# Patient Record
Sex: Female | Born: 1970 | Race: White | Hispanic: No | Marital: Married | Smoking: Never smoker
Health system: Southern US, Community
[De-identification: ages and names within clinical notes are randomized; demographics above are authoritative.]

## PROBLEM LIST (undated history)

## (undated) DIAGNOSIS — F329 Major depressive disorder, single episode, unspecified: Secondary | ICD-10-CM

## (undated) DIAGNOSIS — F419 Anxiety disorder, unspecified: Secondary | ICD-10-CM

## (undated) DIAGNOSIS — M797 Fibromyalgia: Secondary | ICD-10-CM

## (undated) DIAGNOSIS — T50901A Poisoning by unspecified drugs, medicaments and biological substances, accidental (unintentional), initial encounter: Secondary | ICD-10-CM

## (undated) DIAGNOSIS — K589 Irritable bowel syndrome without diarrhea: Secondary | ICD-10-CM

## (undated) DIAGNOSIS — F32A Depression, unspecified: Secondary | ICD-10-CM

## (undated) DIAGNOSIS — R000 Tachycardia, unspecified: Secondary | ICD-10-CM

## (undated) HISTORY — PX: CHOLECYSTECTOMY: SHX55

## (undated) HISTORY — DX: Anxiety disorder, unspecified: F41.9

## (undated) HISTORY — PX: APPENDECTOMY: SHX54

## (undated) HISTORY — DX: Depression, unspecified: F32.A

## (undated) HISTORY — DX: Major depressive disorder, single episode, unspecified: F32.9

---

## 2015-12-11 ENCOUNTER — Telehealth (HOSPITAL_COMMUNITY): Payer: Self-pay | Admitting: *Deleted

## 2015-12-11 NOTE — Telephone Encounter (Signed)
Office received ref from Dr. Sherryll BurgerShah office to sch a New Pt appt for pt. Called pt to sch appt and a message came up stating pt do not have voicemail and to try call again later.

## 2015-12-21 ENCOUNTER — Telehealth: Payer: Self-pay | Admitting: Cardiology

## 2015-12-21 NOTE — Telephone Encounter (Signed)
Received [facility] for appointment on 01/14/16 with Dr Antoine PocheHochrein.  Records given to [employer] Hines (medical records)

## 2015-12-21 NOTE — Telephone Encounter (Signed)
Appointment on 01/14/16 with Dr [place]

## 2016-01-06 ENCOUNTER — Telehealth (HOSPITAL_COMMUNITY): Payer: Self-pay | Admitting: *Deleted

## 2016-01-06 NOTE — Telephone Encounter (Signed)
phone call to patient regarding an appointment.  patient's voice mail not yet set up.  will contact referring doctor office.

## 2016-01-12 ENCOUNTER — Other Ambulatory Visit: Payer: Self-pay | Admitting: *Deleted

## 2016-01-12 ENCOUNTER — Encounter: Payer: Self-pay | Admitting: *Deleted

## 2016-01-13 NOTE — Progress Notes (Deleted)
Cardiology Office Note   Date:  01/13/2016   ID:  Betty Osborne, DOB [birth year], MRN [id number]  PCP:  No primary care provider on file.  Cardiologist:   Rollene Rotunda, MD   No chief complaint on file.     History of Present Illness: Betty Osborne is a 45 y.o. female who presents for ***    Past Medical History:  Diagnosis Date  . Anxiety and depression     Past Surgical History:  Procedure Laterality Date  . CHOLECYSTECTOMY       Current Outpatient Prescriptions  Medication Sig Dispense Refill  . busPIRone (BUSPAR) 15 MG tablet Take 15 mg by mouth 2 (two) times daily.  1  . cyclobenzaprine (FLEXERIL) 10 MG tablet Take 10 mg by mouth 2 (two) times daily.  1  . DASETTA 1/35 tablet Take 1 tablet by mouth daily.  3  . DULoxetine (CYMBALTA) 60 MG capsule Take 60 mg by mouth daily.  1  . hydrOXYzine (VISTARIL) 25 MG capsule Take 25 mg by mouth 3 (three) times daily.  0  . LORazepam (ATIVAN) 1 MG tablet Take 1 mg by mouth 2 (two) times daily as needed.  1   No current facility-administered medications for this visit.     Allergies:   Review of patient's allergies indicates not on file.    Social History:  The patient  reports that she has quit smoking. She does not have any smokeless tobacco history on file.   Family History:  The patient's ***family history includes Heart disease in her father.    ROS:  Please see the history of present illness.   Otherwise, review of systems are positive for {NONE DEFAULTED:18576::"none"}.   All other systems are reviewed and negative.    PHYSICAL EXAM: VS:  There were no vitals taken for this visit. , BMI There is no height or weight on file to calculate BMI. GENERAL:  Well appearing HEENT:  Pupils equal round and reactive, fundi not visualized, oral mucosa unremarkable NECK:  No jugular venous distention, waveform within normal limits, carotid upstroke brisk and symmetric, no bruits, no thyromegaly LYMPHATICS:  No  cervical, inguinal adenopathy LUNGS:  Clear to auscultation bilaterally BACK:  No CVA tenderness CHEST:  Unremarkable HEART:  PMI not displaced or sustained,S1 and S2 within normal limits, no S3, no S4, no clicks, no rubs, *** murmurs ABD:  Flat, positive bowel sounds normal in frequency in pitch, no bruits, no rebound, no guarding, no midline pulsatile mass, no hepatomegaly, no splenomegaly EXT:  2 plus pulses throughout, no edema, no cyanosis no clubbing SKIN:  No rashes no nodules NEURO:  Cranial nerves II through XII grossly intact, motor grossly intact throughout PSYCH:  Cognitively intact, oriented to person place and time    EKG:  EKG {ACTION; IS/IS XLK:44010272} ordered today. The ekg ordered today demonstrates ***   Recent Labs: No results found for requested labs within last 8760 hours.    Lipid Panel No results found for: CHOL, TRIG, HDL, CHOLHDL, VLDL, LDLCALC, LDLDIRECT    Wt Readings from Last 3 Encounters:  No data found for Wt      Other studies Reviewed: Additional studies/ records that were reviewed today include: ***. Review of the above records demonstrates:  Please see elsewhere in the note.  ***   ASSESSMENT AND PLAN:  ***   Current medicines are reviewed at length with the patient today.  The patient {ACTIONS; HAS/DOES NOT HAVE:19233} concerns regarding medicines.  The following changes have been made:  {PLAN; NO CHANGE:13088:s}  Labs/ tests ordered today include: *** No orders of the defined types were placed in this encounter.    Disposition:   FU with ***    Signed, Rollene RotundaJames Laquincy Eastridge, MD  01/13/2016 7:24 AM    [facility]

## 2016-01-14 ENCOUNTER — Ambulatory Visit: Payer: Self-pay | Admitting: Cardiology

## 2016-02-17 ENCOUNTER — Telehealth (HOSPITAL_COMMUNITY): Payer: Self-pay | Admitting: *Deleted

## 2016-02-17 NOTE — Telephone Encounter (Signed)
Office received ref for pt and pt is sch

## 2016-02-17 NOTE — Telephone Encounter (Signed)
Phone call regarding an appoiontment, no voice mail set up.

## 2016-02-24 ENCOUNTER — Ambulatory Visit: Payer: Self-pay | Admitting: Cardiology

## 2016-03-02 ENCOUNTER — Encounter: Payer: Self-pay | Admitting: Gastroenterology

## 2016-03-02 NOTE — Progress Notes (Deleted)
Psychiatric Initial Adult Assessment   Patient Identification: Betty Osborne MRN:  [id number] Date of Evaluation:  03/02/2016 Referral Source: *** Chief Complaint:   Visit Diagnosis: No diagnosis found.  History of Present Illness:  ***  Associated Signs/Symptoms: Depression Symptoms:  {DEPRESSION SYMPTOMS:20000} (Hypo) Manic Symptoms:  {BHH MANIC SYMPTOMS:22872} Anxiety Symptoms:  {BHH ANXIETY SYMPTOMS:22873} Psychotic Symptoms:  {BHH PSYCHOTIC SYMPTOMS:22874} PTSD Symptoms: {BHH PTSD SYMPTOMS:22875}  Past Psychiatric History: ***  Previous Psychotropic Medications: {YES/NO:21197}  Substance Abuse History in the last 12 months:  {yes no:314532}  Consequences of Substance Abuse: {BHH CONSEQUENCES OF SUBSTANCE ABUSE:22880}  Past Medical History:  Past Medical History:  Diagnosis Date  . Anxiety and depression     Past Surgical History:  Procedure Laterality Date  . CHOLECYSTECTOMY      Family Psychiatric History: ***  Family History:  Family History  Problem Relation Age of Onset  . Heart disease Father     had a heart transplant in 2008    Social History:   Social History   Social History  . Marital status: Married    Spouse name: N/A  . Number of children: N/A  . Years of education: N/A   Social History Main Topics  . Smoking status: Former Games developer  . Smokeless tobacco: Not on file  . Alcohol use Not on file  . Drug use: Unknown  . Sexual activity: Not on file   Other Topics Concern  . Not on file   Social History Narrative  . No narrative on file    Additional Social History: ***  Allergies:  Allergies not on file  Metabolic Disorder Labs: No results found for: HGBA1C, MPG No results found for: PROLACTIN No results found for: CHOL, TRIG, HDL, CHOLHDL, VLDL, LDLCALC   Current Medications: Current Outpatient Prescriptions  Medication Sig Dispense Refill  . busPIRone (BUSPAR) 15 MG tablet Take 15 mg by mouth 2 (two) times daily.  1   . cyclobenzaprine (FLEXERIL) 10 MG tablet Take 10 mg by mouth 2 (two) times daily.  1  . DASETTA 1/35 tablet Take 1 tablet by mouth daily.  3  . DULoxetine (CYMBALTA) 60 MG capsule Take 60 mg by mouth daily.  1  . hydrOXYzine (VISTARIL) 25 MG capsule Take 25 mg by mouth 3 (three) times daily.  0  . LORazepam (ATIVAN) 1 MG tablet Take 1 mg by mouth 2 (two) times daily as needed.  1   No current facility-administered medications for this visit.     Neurologic: Headache: {BHH YES OR NO:22294} Seizure: {BHH YES OR NO:22294} Paresthesias:{BHH YES OR WU:98119}  Musculoskeletal: Strength & Muscle Tone: {desc; muscle tone:32375} Gait & Station: {PE GAIT ED JYNW:29562} Patient leans: {Patient Leans:21022755}  Psychiatric Specialty Exam: ROS  There were no vitals taken for this visit.There is no height or weight on file to calculate BMI.  General Appearance: {Appearance:22683}  Eye Contact:  {BHH EYE CONTACT:22684}  Speech:  {Speech:22685}  Volume:  {Volume (PAA):22686}  Mood:  {BHH MOOD:22306}  Affect:  {Affect (PAA):22687}  Thought Process:  {Thought Process (PAA):22688}  Orientation:  {BHH ORIENTATION (PAA):22689}  Thought Content:  {Thought Content:22690}  Suicidal Thoughts:  {ST/HT (PAA):22692}  Homicidal Thoughts:  {ST/HT (PAA):22692}  Memory:  {BHH MEMORY:22881}  Judgement:  {Judgement (PAA):22694}  Insight:  {Insight (PAA):22695}  Psychomotor Activity:  {Psychomotor (PAA):22696}  Concentration:  {Concentration:21399}  Recall:  {BHH GOOD/FAIR/POOR:22877}  Fund of Knowledge:{BHH GOOD/FAIR/POOR:22877}  Language: {BHH GOOD/FAIR/POOR:22877}  Akathisia:  {BHH YES OR NO:22294}  Handed:  {Handed:22697}  AIMS (if indicated):  ***  Assets:  {Assets (PAA):22698}  ADL's:  {BHH ZOX'W:96045}ADL'S:22290}  Cognition: {chl bhh cognition:304700322}  Sleep:  ***    Treatment Plan Summary: {CHL AMB BH MD TX WUJW:1191478295}[phone]}   Neysa Hottereina Shelaine Frie, MD 9/27/201712:35 PM

## 2016-03-03 ENCOUNTER — Ambulatory Visit (HOSPITAL_COMMUNITY): Payer: Self-pay | Admitting: Psychiatry

## 2016-03-21 ENCOUNTER — Telehealth: Payer: Self-pay | Admitting: Gastroenterology

## 2016-03-21 ENCOUNTER — Ambulatory Visit: Payer: Self-pay | Admitting: Gastroenterology

## 2016-03-21 ENCOUNTER — Encounter: Payer: Self-pay | Admitting: Gastroenterology

## 2016-03-21 NOTE — Telephone Encounter (Signed)
PT WAS A NO SHOW AND LETTER SENT  °

## 2016-05-19 ENCOUNTER — Encounter (HOSPITAL_COMMUNITY): Payer: Self-pay

## 2016-05-19 ENCOUNTER — Emergency Department (HOSPITAL_COMMUNITY)
Admission: EM | Admit: 2016-05-19 | Discharge: 2016-05-19 | Payer: Medicaid Other | Attending: Emergency Medicine | Admitting: Emergency Medicine

## 2016-05-19 ENCOUNTER — Inpatient Hospital Stay (HOSPITAL_COMMUNITY)
Admission: AD | Admit: 2016-05-19 | Discharge: 2016-05-22 | DRG: 885 | Disposition: A | Discharge status: Other Acute Inpatient Hospital | Payer: Medicaid Other | Source: Intra-hospital | Attending: Family Medicine | Admitting: Family Medicine

## 2016-05-19 DIAGNOSIS — Z7984 Long term (current) use of oral hypoglycemic drugs: Secondary | ICD-10-CM | POA: Insufficient documentation

## 2016-05-19 DIAGNOSIS — G4700 Insomnia, unspecified: Secondary | ICD-10-CM | POA: Diagnosis present

## 2016-05-19 DIAGNOSIS — Z79899 Other long term (current) drug therapy: Secondary | ICD-10-CM | POA: Diagnosis not present

## 2016-05-19 DIAGNOSIS — Z8249 Family history of ischemic heart disease and other diseases of the circulatory system: Secondary | ICD-10-CM

## 2016-05-19 DIAGNOSIS — Z885 Allergy status to narcotic agent status: Secondary | ICD-10-CM

## 2016-05-19 DIAGNOSIS — Z87891 Personal history of nicotine dependence: Secondary | ICD-10-CM | POA: Insufficient documentation

## 2016-05-19 DIAGNOSIS — M797 Fibromyalgia: Secondary | ICD-10-CM | POA: Diagnosis present

## 2016-05-19 DIAGNOSIS — R079 Chest pain, unspecified: Secondary | ICD-10-CM

## 2016-05-19 DIAGNOSIS — D649 Anemia, unspecified: Secondary | ICD-10-CM | POA: Diagnosis present

## 2016-05-19 DIAGNOSIS — E119 Type 2 diabetes mellitus without complications: Secondary | ICD-10-CM | POA: Diagnosis present

## 2016-05-19 DIAGNOSIS — K589 Irritable bowel syndrome without diarrhea: Secondary | ICD-10-CM | POA: Diagnosis present

## 2016-05-19 DIAGNOSIS — F332 Major depressive disorder, recurrent severe without psychotic features: Secondary | ICD-10-CM | POA: Insufficient documentation

## 2016-05-19 DIAGNOSIS — R9439 Abnormal result of other cardiovascular function study: Secondary | ICD-10-CM | POA: Diagnosis not present

## 2016-05-19 DIAGNOSIS — R45851 Suicidal ideations: Secondary | ICD-10-CM | POA: Diagnosis present

## 2016-05-19 DIAGNOSIS — R0789 Other chest pain: Secondary | ICD-10-CM | POA: Diagnosis not present

## 2016-05-19 DIAGNOSIS — Z915 Personal history of self-harm: Secondary | ICD-10-CM | POA: Diagnosis not present

## 2016-05-19 DIAGNOSIS — I10 Essential (primary) hypertension: Secondary | ICD-10-CM | POA: Diagnosis present

## 2016-05-19 DIAGNOSIS — F329 Major depressive disorder, single episode, unspecified: Secondary | ICD-10-CM | POA: Diagnosis present

## 2016-05-19 DIAGNOSIS — Z9889 Other specified postprocedural states: Secondary | ICD-10-CM | POA: Diagnosis not present

## 2016-05-19 DIAGNOSIS — R072 Precordial pain: Secondary | ICD-10-CM | POA: Diagnosis not present

## 2016-05-19 DIAGNOSIS — Z9049 Acquired absence of other specified parts of digestive tract: Secondary | ICD-10-CM

## 2016-05-19 HISTORY — DX: Poisoning by unspecified drugs, medicaments and biological substances, accidental (unintentional), initial encounter: T50.901A

## 2016-05-19 HISTORY — DX: Irritable bowel syndrome, unspecified: K58.9

## 2016-05-19 HISTORY — DX: Fibromyalgia: M79.7

## 2016-05-19 HISTORY — DX: Tachycardia, unspecified: R00.0

## 2016-05-19 LAB — CBC WITH DIFFERENTIAL/PLATELET
BASOS ABS: 0.1 10*3/uL (ref 0.0–0.1)
BASOS PCT: 1 %
Eosinophils Absolute: 0.4 10*3/uL (ref 0.0–0.7)
Eosinophils Relative: 4 %
HEMATOCRIT: 40.7 % (ref 36.0–46.0)
HEMOGLOBIN: 13.6 g/dL (ref 12.0–15.0)
LYMPHS PCT: 32 %
Lymphs Abs: 3.1 10*3/uL (ref 0.7–4.0)
MCH: 29.4 pg (ref 26.0–34.0)
MCHC: 33.4 g/dL (ref 30.0–36.0)
MCV: 88.1 fL (ref 78.0–100.0)
Monocytes Absolute: 0.6 10*3/uL (ref 0.1–1.0)
Monocytes Relative: 6 %
NEUTROS PCT: 57 %
Neutro Abs: 5.8 10*3/uL (ref 1.7–7.7)
Platelets: 298 10*3/uL (ref 150–400)
RBC: 4.62 MIL/uL (ref 3.87–5.11)
RDW: 12.7 % (ref 11.5–15.5)
WBC: 10 10*3/uL (ref 4.0–10.5)

## 2016-05-19 LAB — ETHANOL: Alcohol, Ethyl (B): <5 mg/dL (ref <5)

## 2016-05-19 LAB — RAPID URINE DRUG SCREEN, HOSP PERFORMED
AMPHETAMINES: NOT DETECTED
BENZODIAZEPINES: NOT DETECTED
Barbiturates: NOT DETECTED
COCAINE: NOT DETECTED
OPIATES: NOT DETECTED
TETRAHYDROCANNABINOL: NOT DETECTED

## 2016-05-19 LAB — BASIC METABOLIC PANEL
ANION GAP: 8 (ref 5–15)
BUN: 13 mg/dL (ref 6–20)
CHLORIDE: 96 mmol/L — AB (ref 101–111)
CO2: 28 mmol/L (ref 22–32)
Calcium: 9.2 mg/dL (ref 8.9–10.3)
Creatinine, Ser: 0.8 mg/dL (ref 0.44–1.00)
GFR calc non Af Amer: >60 mL/min (ref >60)
Glucose, Bld: 93 mg/dL (ref 65–99)
POTASSIUM: 3.7 mmol/L (ref 3.5–5.1)
Sodium: 132 mmol/L — ABNORMAL LOW (ref 135–145)

## 2016-05-19 MED ORDER — ATENOLOL 50 MG PO TABS
50.0000 mg | ORAL_TABLET | Freq: Every day | ORAL | Status: DC
  Administered 2016-05-20 – 2016-05-22 (×3): 50 mg via ORAL
  Filled 2016-05-19: qty 1
  Filled 2016-05-19: qty 2
  Filled 2016-05-19 (×4): qty 1

## 2016-05-19 MED ORDER — LORAZEPAM 1 MG PO TABS
1.0000 mg | ORAL_TABLET | Freq: Once | ORAL | Status: AC
  Administered 2016-05-19: 1 mg via ORAL
  Filled 2016-05-19: qty 1

## 2016-05-19 MED ORDER — MAGNESIUM HYDROXIDE 400 MG/5ML PO SUSP: 30.0000 mL | Freq: Every day | ORAL | Status: DC | PRN

## 2016-05-19 MED ORDER — ACETAMINOPHEN 325 MG PO TABS
650.0000 mg | ORAL_TABLET | Freq: Four times a day (QID) | ORAL | Status: DC | PRN
  Administered 2016-05-20 – 2016-05-21 (×2): 650 mg via ORAL
  Filled 2016-05-19 (×2): qty 2

## 2016-05-19 MED ORDER — HYDROXYZINE HCL 25 MG PO TABS
25.0000 mg | ORAL_TABLET | Freq: Four times a day (QID) | ORAL | Status: DC | PRN
  Administered 2016-05-20 (×2): 25 mg via ORAL
  Filled 2016-05-19 (×2): qty 1

## 2016-05-19 MED ORDER — FLUTICASONE PROPIONATE 50 MCG/ACT NA SUSP
2.0000 | Freq: Every day | NASAL | Status: DC
  Administered 2016-05-20 – 2016-05-22 (×3): 2 via NASAL
  Filled 2016-05-19 (×2): qty 16

## 2016-05-19 MED ORDER — METFORMIN HCL 500 MG PO TABS
1000.0000 mg | ORAL_TABLET | Freq: Two times a day (BID) | ORAL | Status: DC
  Administered 2016-05-20 – 2016-05-22 (×6): 1000 mg via ORAL
  Filled 2016-05-19 (×11): qty 2

## 2016-05-19 MED ORDER — ALUM & MAG HYDROXIDE-SIMETH 200-200-20 MG/5ML PO SUSP: 30.0000 mL | ORAL | Status: DC | PRN

## 2016-05-19 MED ORDER — DULOXETINE HCL 60 MG PO CPEP
90.0000 mg | ORAL_CAPSULE | Freq: Every day | ORAL | Status: DC
  Administered 2016-05-20: 08:00:00 90 mg via ORAL
  Filled 2016-05-19 (×3): qty 1

## 2016-05-19 MED ORDER — ACETAMINOPHEN 325 MG PO TABS
650.0000 mg | ORAL_TABLET | Freq: Once | ORAL | Status: AC
  Administered 2016-05-19: 650 mg via ORAL
  Filled 2016-05-19: qty 2

## 2016-05-19 MED ORDER — LORAZEPAM 1 MG PO TABS
ORAL_TABLET | ORAL | Status: AC
  Filled 2016-05-19: qty 1

## 2016-05-19 MED ORDER — TRAZODONE HCL 50 MG PO TABS
50.0000 mg | ORAL_TABLET | Freq: Every evening | ORAL | Status: DC | PRN
  Administered 2016-05-20 – 2016-05-21 (×3): 50 mg via ORAL
  Filled 2016-05-19 (×3): qty 1

## 2016-05-19 NOTE — ED Notes (Signed)
Called [facility] for transport to [facility]. 

## 2016-05-19 NOTE — BH Assessment (Addendum)
Tele Assessment Note   Betty Osborne is an 45 y.o. female who presents voluntarily  reporting symptoms of depression, no sleep, lack of functioning, showering. Pt states that she was discharged from [facility] on November 30th, and "I really thought I was ready for discharge, but they took me off many of my medications and changed some things and I am not functioning". She states that this is the 2nd Christmas without her mom and and that plus the stress of caring for her dad as an only child is too much for her,  "I don't want to try to kill myself again, I almost died last time. I want to live for my husband and 14yo son".  Pt reports medication compliance, but losing her sleep medication and anti-anxiety meds has been hard for her.  Past attempts include a serious overdose in November and another attempt two years ago and she had an admission at [facility] at that time. PT denies homicidal ideation/ history of violence. Pt denies auditory or visual hallucinations or other psychotic symptoms. Pt lives with her husband and son who are her supports.  Pt denies history of abuse and trauma.Pt  reports there is a family history of mental illness. Pt has good insight and judgment. Pt's memory is typical. Pt denies legal history. ? Pt has no OP provider, but has been trying to get one since her discharge. Pt has a history of alcohol abuse. ? MSE: Pt is casually dressed, alert, oriented x4 with normal speech and normal motor behavior. Eye contact is good. Pt's mood is depressed and affect is depressed and anxious. Affect is congruent with mood. Thought process is coherent and relevant. There is no indication Pt is currently responding to internal stimuli or experiencing delusional thought content. Pt was cooperative throughout assessment. Pt is currently unable to contract for safety outside the hospital and wants inpatient psychiatric treatment.  Elta Guadeloupe, NP recommends Ip treatment.  TTS to seek  placement. Pt accepted to [facility] when medically cleared per Mardella Layman, [facility].     Diagnosis: MDD, severe without psychotic features, alcohol abuse by history  Past Medical History:  Past Medical History:  Diagnosis Date  . Anxiety and depression   . Fibromyalgia   . IBS (irritable bowel syndrome)   . Overdose   . Tachycardia     Past Surgical History:  Procedure Laterality Date  . APPENDECTOMY    . CHOLECYSTECTOMY      Family History:  Family History  Problem Relation Age of Onset  . Heart disease Father     had a heart transplant in 2008    Social History:  reports that she has quit smoking. She has never used smokeless tobacco. She reports that she does not use drugs. Her alcohol history is not on file.  Additional Social History:  Alcohol / Drug Use Pain Medications: Denies Prescriptions: Denies Over the Counter: Denies History of alcohol / drug use?:  (hx of alcohol abuse) Longest period of sobriety (when/how long): Denies Negative Consequences of Use:  (Denies)  CIWA: CIWA-Ar BP: (!) 134/101 Pulse Rate: 76 COWS:    PATIENT STRENGTHS: (choose at least two) Ability for insight Active sense of humor Average or above average intelligence Capable of independent living Communication skills General fund of knowledge Motivation for treatment/growth Supportive family/friends  Allergies:  Allergies  Allergen Reactions  . Dilaudid [Hydromorphone Hcl]     Home Medications:  (Not in a hospital admission)  OB/GYN Status:  No  LMP recorded. Patient is postmenopausal.  General Assessment Data Location of Assessment: [facility] TTS Assessment: In system Is this a Tele or Face-to-Face Assessment?: Tele Assessment Is this an Initial Assessment or a Re-assessment for this encounter?: Initial Assessment Marital status: Married Living Arrangements: Spouse/significant other 59(14 yo son) Can pt return to current living arrangement?: Yes Admission Status: Voluntary Is  patient capable of signing voluntary admission?: Yes Referral Source: Self/Family/Friend Insurance type: MCD     Crisis Care Plan Living Arrangements: Spouse/significant other 86(14 yo son) Name of Psychiatrist: none Name of Therapist: no  Education Status Is patient currently in school?: No  Risk to self with the past 6 months Suicidal Ideation: No-Not Currently/Within Last 6 Months Has patient been a risk to self within the past 6 months prior to admission? : Yes Suicidal Intent: No-Not Currently/Within Last 6 Months Has patient had any suicidal intent within the past 6 months prior to admission? : Yes Is patient at risk for suicide?: Yes Suicidal Plan?: No-Not Currently/Within Last 6 Months Has patient had any suicidal plan within the past 6 months prior to admission? : Yes Access to Means: Yes Specify Access to Suicidal Means: overdose What has been your use of drugs/alcohol within the last 12 months?: denies Previous Attempts/Gestures: Yes How many times?: 3 Other Self Harm Risks:  (none known) Triggers for Past Attempts: Unpredictable Intentional Self Injurious Behavior: None Family Suicide History:  (aunt attempted) Recent stressful life event(s):  (mom died two years ago, taking care of dad) Persecutory voices/beliefs?: No Depression: Yes Depression Symptoms: Despondent, Insomnia, Tearfulness, Isolating, Fatigue, Guilt, Loss of interest in usual pleasures, Feeling worthless/self pity, Feeling angry/irritable Substance abuse history and/or treatment for substance abuse?: Yes Suicide prevention information given to non-admitted patients: Not applicable  Risk to Others within the past 6 months Homicidal Ideation: No Does patient have any lifetime risk of violence toward others beyond the six months prior to admission? : No Thoughts of Harm to Others: No Current Homicidal Intent: No Current Homicidal Plan: No Access to Homicidal Means: No History of harm to others?:  No Assessment of Violence: None Noted Does patient have access to weapons?: Yes (Comment) Criminal Charges Pending?: No Does patient have a court date: No Is patient on probation?: No  Psychosis Hallucinations: None noted Delusions: None noted  Mental Status Report Appearance/Hygiene: Disheveled, Revealing clothes/seductive clothing Eye Contact: Good Motor Activity: Unremarkable Speech: Logical/coherent Level of Consciousness: Alert Mood: Depressed, Sad Affect: Anxious, Depressed Anxiety Level: Moderate Thought Processes: Coherent, Relevant Judgement: Unimpaired Orientation: Person, Place, Time, Situation, Appropriate for developmental age Obsessive Compulsive Thoughts/Behaviors: None  Cognitive Functioning Concentration: Decreased Memory: Recent Intact, Remote Intact IQ: Average Insight: Good Impulse Control: Fair Appetite: Poor Weight Loss: 5 Weight Gain:  (with medications) Sleep: Decreased Total Hours of Sleep: 3 Vegetative Symptoms: Staying in bed, Decreased grooming  ADLScreening [facility] Assessment Services) Patient's cognitive ability adequate to safely complete daily activities?: Yes Patient able to express need for assistance with ADLs?: Yes Independently performs ADLs?: Yes (appropriate for developmental age)  Prior Inpatient Therapy Prior Inpatient Therapy: Yes Prior Therapy Dates: November 2017, 2015 [facility] Reason for Treatment: depression  Prior Outpatient Therapy Prior Outpatient Therapy: No Does patient have an ACCT team?: No Does patient have Intensive In-House Services?  : No Does patient have [facility] services? : No Does patient have P4CC services?: No  ADL Screening (condition at time of admission) Patient's cognitive ability adequate to safely complete daily activities?: Yes Is the  patient deaf or have difficulty hearing?: No Does the patient have difficulty seeing, even when wearing  glasses/contacts?: No Does the patient have difficulty concentrating, remembering, or making decisions?: No Patient able to express need for assistance with ADLs?: Yes Does the patient have difficulty dressing or bathing?: No Independently performs ADLs?: Yes (appropriate for developmental age) Does the patient have difficulty walking or climbing stairs?: No Weakness of Legs: None Weakness of Arms/Hands: None  Home Assistive Devices/Equipment Home Assistive Devices/Equipment: None    Abuse/Neglect Assessment (Assessment to be complete while patient is alone) Physical Abuse: Denies Verbal Abuse: Denies Sexual Abuse: Denies Exploitation of patient/patient's resources: Denies Self-Neglect: Denies Values / Beliefs Cultural Requests During Hospitalization: None Spiritual Requests During Hospitalization: None   Advance Directives (For Healthcare) Does Patient Have a Medical Advance Directive?: No Would patient like information on creating a medical advance directive?: No - Patient declined    Additional Information 1:1 In Past 12 Months?: No CIRT Risk: No Elopement Risk: No Does patient have medical clearance?: Yes     Disposition:  Disposition Initial Assessment Completed for this Encounter: Yes Disposition of Patient: Inpatient treatment program Type of inpatient treatment program: Adult  [facility],Nickayla Mcinnis Hines 05/19/2016 4:20 PM

## 2016-05-19 NOTE — ED Notes (Addendum)
Patient requesting something for his abdominal pain. Verbal order given by Dr. Hyacinth MeekerMiller.

## 2016-05-19 NOTE — ED Provider Notes (Signed)
[facility] DEPT Provider Note   CSN: [id number] Arrival date & time: 05/19/16  1251     History   Chief Complaint Chief Complaint  Patient presents with  . Depression    HPI Betty Osborne is a 45 y.o. female.  HPI  The patient is a 45 year old female who has a history of anxiety and depression with several suicide attempts in the past including an overdose followed by cutting her wrists followed by second overdose. Her most recent overdose was in the middle of November after which she spent 12 days in a psychiatric facility and was discharged on November 30 back to her home. She reports that at that time she was feeling much better, she was not depressed anxious or suicidal and initially was doing very well however after 3 days she started to sleep last, started to have intermittent anxiety which has become significantly worse. She reports that this is a tough season for her with Christmas, this is a second year in a row that she has been without her mother, she feels as though she is on for her for the Christmas season, she is taking care of her father who is also chronically ill. She has had several worsening episodes of anxiety, uncontrolled crying, she is not suicidal but needs help. She reports that she tried to get in with a psychiatrist but has been unable to be seen when she called for a follow-up appointment. She has recently been given certain medications which she feels occasionally help including an antihistamine as well as Celexa and gabapentin. She does not abuse any drugs  Past Medical History:  Diagnosis Date  . Anxiety and depression   . Fibromyalgia   . IBS (irritable bowel syndrome)   . Overdose   . Tachycardia     There are no active problems to display for this patient.   Past Surgical History:  Procedure Laterality Date  . APPENDECTOMY    . CHOLECYSTECTOMY      OB History    No data available       Home Medications    Prior to Admission  medications   Medication Sig Start Date End Date Taking? Authorizing Provider  busPIRone (BUSPAR) 15 MG tablet Take 15 mg by mouth 2 (two) times daily. 12/02/15   Historical Provider, MD  cyclobenzaprine (FLEXERIL) 10 MG tablet Take 10 mg by mouth 2 (two) times daily. 12/02/15   Historical Provider, MD  DASETTA 1/35 tablet Take 1 tablet by mouth daily. 12/02/15   Historical Provider, MD  DULoxetine (CYMBALTA) 60 MG capsule Take 60 mg by mouth daily. 12/04/15   Historical Provider, MD  hydrOXYzine (VISTARIL) 25 MG capsule Take 25 mg by mouth 3 (three) times daily. 12/07/15   Historical Provider, MD  LORazepam (ATIVAN) 1 MG tablet Take 1 mg by mouth 2 (two) times daily as needed. 12/02/15   Historical Provider, MD    Family History Family History  Problem Relation Age of Onset  . Heart disease Father     had a heart transplant in 2008    Social History Social History  Substance Use Topics  . Smoking status: Former Games developermoker  . Smokeless tobacco: Never Used  . Alcohol use Not on file     Comment: former     Allergies   Dilaudid [hydromorphone hcl]   Review of Systems Review of Systems  All other systems reviewed and are negative.    Physical Exam Updated Vital Signs BP (!) 134/101 (BP Location:  Left Arm)   Pulse 76   Temp 98.9 F (37.2 C) (Temporal)   Resp 20   Ht 5\' 4"  (1.626 m)   Wt 160 lb (72.6 kg)   SpO2 100%   BMI 27.46 kg/m   Physical Exam  Constitutional: She appears well-developed and well-nourished. No distress.  HENT:  Head: Normocephalic and atraumatic.  Mouth/Throat: Oropharynx is clear and moist. No oropharyngeal exudate.  Eyes: Conjunctivae and EOM are normal. Pupils are equal, round, and reactive to light. Right eye exhibits no discharge. Left eye exhibits no discharge. No scleral icterus.  Neck: Normal range of motion. Neck supple. No JVD present. No thyromegaly present.  Cardiovascular: Normal rate, regular rhythm, normal heart sounds and intact distal  pulses.  Exam reveals no gallop and no friction rub.   No murmur heard. Pulmonary/Chest: Effort normal and breath sounds normal. No respiratory distress. She has no wheezes. She has no rales.  Abdominal: Soft. Bowel sounds are normal. She exhibits no distension and no mass. There is tenderness ( diffuse mild ttp without guarding).  Musculoskeletal: Normal range of motion. She exhibits no edema or tenderness.  Lymphadenopathy:    She has no cervical adenopathy.  Neurological: She is alert. Coordination normal.  Skin: Skin is warm and dry. No rash noted. No erythema.  Psychiatric: She has a normal mood and affect. Her behavior is normal.  Nursing note and vitals reviewed.    ED Treatments / Results  Labs (all labs ordered are listed, but only abnormal results are displayed) Labs Reviewed  BASIC METABOLIC PANEL - Abnormal; Notable for the following:       Result Value   Sodium 132 (*)    Chloride 96 (*)    All other components within normal limits  CBC WITH DIFFERENTIAL/PLATELET  ETHANOL  RAPID URINE DRUG SCREEN, HOSP PERFORMED    Radiology No results found.  Procedures Procedures (including critical care time)  Medications Ordered in ED Medications - No data to display   Initial Impression / Assessment and Plan / ED Course  I have reviewed the triage vital signs and the nursing notes.  Pertinent labs & imaging results that were available during my care of the patient were reviewed by me and considered in my medical decision making (see chart for details).  Clinical Course     The patient has ongoing depression and anxiety, often becoming uncontrollably tearful during the interview She would benefit from psychiatric assessment though at this time I do not think she needs to be involuntarily committed  [facility] will [facility]  Final Clinical Impressions(s) / ED Diagnoses   Final diagnoses:  Severe episode of recurrent major depressive  disorder, without psychotic features [facility](HCC)    New Prescriptions New Prescriptions   No medications on file     Eber HongBrian Carrisa Keller, MD 05/19/16 2008

## 2016-05-19 NOTE — ED Notes (Signed)
Patient stating her abdominal pain is no better and she feels anxious. Spoke to Dr. Hyacinth MeekerMiller and received new orders.

## 2016-05-19 NOTE — ED Notes (Signed)
Patient husband her took purse home with him [facility]

## 2016-05-19 NOTE — BH Assessment (Signed)
[facility] Elta GuadeloupeLaurie Parks, NP recommends Ip treatment.  TTS to seek placement. [id number] medically [facility]sey[facility]

## 2016-05-19 NOTE — ED Triage Notes (Signed)
Pt reports was [facility] because she overdosed in attempt to commit suicide on Nov 19.  Pt was discharged on Nov 30 but has not had any behavioral health follow up since then.  She has seen pcp several times since discharge.  Says her pcp instructed her to follow up with [facility] but was told she couldn't come there until she had the paperwork from where she was discharged.  Pt says this was her 3rd attempt at suicide and says she almost died last time.  Pt says has been having panic attacks, tearful, and overwhelmed.  Reports has not had a shower in 3 days before today.  Pt says she lost her mother last year and her father is having some medical problems.  Also is having some financial problems.  Pt also reports she didn't take her son to school yesterday because she couldn't get out of bed.  She states, "life is overwhelming" but doesn't want to kill herself at this time.

## 2016-05-20 ENCOUNTER — Encounter (HOSPITAL_COMMUNITY): Payer: Self-pay | Admitting: *Deleted

## 2016-05-20 DIAGNOSIS — R079 Chest pain, unspecified: Secondary | ICD-10-CM

## 2016-05-20 DIAGNOSIS — Z79899 Other long term (current) drug therapy: Secondary | ICD-10-CM

## 2016-05-20 DIAGNOSIS — D649 Anemia, unspecified: Secondary | ICD-10-CM

## 2016-05-20 DIAGNOSIS — Z8249 Family history of ischemic heart disease and other diseases of the circulatory system: Secondary | ICD-10-CM

## 2016-05-20 DIAGNOSIS — F332 Major depressive disorder, recurrent severe without psychotic features: Principal | ICD-10-CM

## 2016-05-20 MED ORDER — GABAPENTIN 400 MG PO CAPS
800.0000 mg | ORAL_CAPSULE | Freq: Three times a day (TID) | ORAL | Status: DC
  Administered 2016-05-20 – 2016-05-22 (×8): 800 mg via ORAL
  Filled 2016-05-20 (×13): qty 2

## 2016-05-20 MED ORDER — CITALOPRAM HYDROBROMIDE 10 MG PO TABS: 10.0000 mg | ORAL_TABLET | Freq: Every day | ORAL | Status: DC

## 2016-05-20 MED ORDER — CITALOPRAM HYDROBROMIDE 10 MG PO TABS
10.0000 mg | ORAL_TABLET | Freq: Every day | ORAL | Status: DC
  Administered 2016-05-21 – 2016-05-22 (×2): 10 mg via ORAL
  Filled 2016-05-20 (×4): qty 1

## 2016-05-20 MED ORDER — BUPROPION HCL ER (XL) 150 MG PO TB24
150.0000 mg | ORAL_TABLET | Freq: Every day | ORAL | Status: DC
  Administered 2016-05-20 – 2016-05-22 (×3): 150 mg via ORAL
  Filled 2016-05-20 (×5): qty 1

## 2016-05-20 MED ORDER — LORAZEPAM 1 MG PO TABS
0.5000 mg | ORAL_TABLET | Freq: Four times a day (QID) | ORAL | Status: DC | PRN
  Administered 2016-05-20 – 2016-05-22 (×5): 0.5 mg via ORAL
  Filled 2016-05-20 (×6): qty 1

## 2016-05-20 NOTE — Progress Notes (Signed)
Vitals entered on behalf of night shift MHT 

## 2016-05-20 NOTE — H&P (Addendum)
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 MDD (major depressive disorder), recurrent severe, without psychosis ([place]) [F33.2] 05/19/2016   History of Present Illness: 45 year old female . States she was recently admitted to an inpatient psychiatric unit due to a suicide attempt by overdosing ( flexeril, ativan) , returned home about 1-2 weeks ago. States " I was initially  feeling better in the hospital, but back at home I have not been doing well ". Describes worsening anxiety, frequent  panic attacks, and " feeling like I am having melt downs". She also describes worsening depression, worsening neuro-vegetative symptoms, such as insomnia, low energy level, and states " I know myself and I felt I was losing ground and if I did not get the help I would end up trying to kill myself again "  Associated Signs/Symptoms: Depression Symptoms:  depressed mood, anhedonia, insomnia, suicidal thoughts without plan, anxiety, panic attacks, loss of energy/fatigue, decreased appetite,  Low sense of self esteem (Hypo) Manic Symptoms:  Does not endorse . Anxiety Symptoms:  Describes recent increased frequency panic attacks and emerging agoraphobia.  Psychotic Symptoms:  Denies  PTSD Symptoms: Denies  Total Time spent with patient: 45 minutes  Past Psychiatric History: has had two prior psychiatric admissions, has attempted suicide three times , first one three years ago.(Overdose attempt). Denies any history of self cutting. Denies any history of mania, hypomania, denies history of psychosis, denies history of PTSD, denies history of violence.   Is the patient at risk to self? Yes.    Has the  patient been a risk to self in the past 6 months? Yes.    Has the patient been a risk to self within the distant past? Yes.    Is the patient a risk to others? No.  Has the patient been a risk to others in the past 6 months? No.  Has the patient been a risk to others within the distant past? No.   Prior Inpatient Therapy:  as above  Prior Outpatient Therapy:  does not currently have a psychiatrist, PCP was prescribing psychiatric medications. Does not have a therapist   Alcohol Screening: 1. How often do you have a drink containing alcohol?: 4 or more times a week 2. How many drinks containing alcohol do you have on a typical day when you are drinking?: 1 or 2 3. How often do you have six or more drinks on one occasion?: Never Preliminary Score: 0 9. Have you or someone else been injured as a result of your drinking?: No 10. Has a relative or friend or a doctor or another health worker been concerned about your drinking or suggested you cut down?: Yes, during the last year Alcohol Use Disorder Identification Test Final Score (AUDIT): 8 Brief Intervention: Yes Substance Abuse History in the last 12 months: states that in the past had been drinking daily- 2-3 beers per day, but that she has not been drinking x 2-3 weeks, no drug abuse, denies abusing prescribed medications . Consequences of Substance Abuse: History of blackouts, no DUI Previous Psychotropic Medications: Cymbalta, which was recently increased from 30 mgrs to 90 mgrs a day, Neurontin 800 mgrs TID, started recently. She had been on Wellbutrin x 3 years  up to 2 weeks ago, Ativan was also stopped three weeks ago, which she had been on for 2-3 years. Of note, feels Wellbutrin was helping more than other medications she has been on. Denies medication side effects. Psychological Evaluations:  No  Past Medical History: of note, patient states she may have had a seizure following her recent overdose on flexeril , ativan ( overdose was  severe and she states she was in ICU initially) .Denies any history of seizure disorder Past Medical History:  Diagnosis Date  . Anxiety and depression   . Fibromyalgia   . IBS (irritable bowel syndrome)   . Overdose   . Tachycardia     Past Surgical History:  Procedure Laterality Date  . APPENDECTOMY    . CHOLECYSTECTOMY     Family History: father alive, mother passed away, possibly from PE, only child Family History  Problem Relation Age of Onset  . Heart disease Father     had a heart transplant in 2008   Family Psychiatric  History: states she has cousins who have depression, anxiety, and two cousins have committed suicide. Has uncles who have alcohol dependence. Tobacco Screening: Have you used any form of tobacco in the last 30 days? (Cigarettes, Smokeless Tobacco, Cigars, and/or Pipes): No Social History: married x 25 years, states marriage is stable, has two sons , 60, 79. 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  History  Alcohol use Not on file    Comment: former     History  Drug Use No    Additional Social History:  Allergies:   Allergies  Allergen Reactions  . Dilaudid [Hydromorphone Hcl]    Lab Results:  Results for orders placed or performed during the hospital encounter of 05/19/16 (from the past 48 hour(s))  Rapid urine drug screen (hospital performed)     Status: None   Collection Time: 05/19/16  1:20 PM  Result Value Ref Range   Opiates NONE DETECTED NONE DETECTED   Cocaine NONE DETECTED NONE DETECTED   Benzodiazepines NONE DETECTED NONE DETECTED   Amphetamines NONE DETECTED NONE DETECTED   Tetrahydrocannabinol NONE DETECTED NONE DETECTED   Barbiturates NONE DETECTED NONE DETECTED    Comment:        DRUG SCREEN FOR MEDICAL PURPOSES ONLY.  IF CONFIRMATION IS NEEDED FOR ANY PURPOSE, NOTIFY LAB WITHIN 5 DAYS.        LOWEST DETECTABLE LIMITS FOR URINE DRUG SCREEN Drug Class       Cutoff  (ng/mL) Amphetamine      1000 Barbiturate      200 Benzodiazepine   509 Tricyclics       326 Opiates          300 Cocaine          300 THC              50   CBC with Differential     Status: None   Collection Time: 05/19/16  1:28 PM  Result Value Ref Range   WBC 10.0 4.0 - 10.5 K/uL   RBC 4.62 3.87 - 5.11 MIL/uL   Hemoglobin 13.6 12.0 - 15.0 g/dL   HCT 40.7 36.0 - 46.0 %   MCV 88.1 78.0 - 100.0 fL   MCH 29.4 26.0 - 34.0 pg   MCHC 33.4 30.0 - 36.0 g/dL   RDW 12.7 11.5 - 15.5 %   Platelets 298 150 - 400 K/uL   Neutrophils Relative % 57 %  Neutro Abs 5.8 1.7 - 7.7 K/uL   Lymphocytes Relative 32 %   Lymphs Abs 3.1 0.7 - 4.0 K/uL   Monocytes Relative 6 %   Monocytes Absolute 0.6 0.1 - 1.0 K/uL   Eosinophils Relative 4 %   Eosinophils Absolute 0.4 0.0 - 0.7 K/uL   Basophils Relative 1 %   Basophils Absolute 0.1 0.0 - 0.1 K/uL  Basic metabolic panel     Status: Abnormal   Collection Time: 05/19/16  1:28 PM  Result Value Ref Range   Sodium 132 (L) 135 - 145 mmol/L   Potassium 3.7 3.5 - 5.1 mmol/L   Chloride 96 (L) 101 - 111 mmol/L   CO2 28 22 - 32 mmol/L   Glucose, Bld 93 65 - 99 mg/dL   BUN 13 6 - 20 mg/dL   Creatinine, Ser 0.80 0.44 - 1.00 mg/dL   Calcium 9.2 8.9 - 10.3 mg/dL   GFR calc non Af Amer >60 >60 mL/min   GFR calc Af Amer >60 >60 mL/min    Comment: (NOTE) The eGFR has been calculated using the CKD EPI equation. This calculation has not been validated in all clinical situations. eGFR's persistently <60 mL/min signify possible Chronic Kidney Disease.    Anion gap 8 5 - 15  Ethanol     Status: None   Collection Time: 05/19/16  1:28 PM  Result Value Ref Range   Alcohol, Ethyl (B) <5 <5 mg/dL    Comment:        LOWEST DETECTABLE LIMIT FOR SERUM ALCOHOL IS 5 mg/dL FOR MEDICAL PURPOSES ONLY     Blood Alcohol level:  Lab Results  Component Value Date   ETH <5 01/77/9390    Metabolic Disorder Labs:  No results found for: HGBA1C, MPG No results found  for: PROLACTIN No results found for: CHOL, TRIG, HDL, CHOLHDL, VLDL, LDLCALC  Current Medications: Current Facility-Administered Medications  Medication Dose Route Frequency Provider Last Rate Last Dose  . acetaminophen (TYLENOL) tablet 650 mg  650 mg Oral Q6H PRN Ethelene Hal, NP      . alum & mag hydroxide-simeth (MAALOX/MYLANTA) 200-200-20 MG/5ML suspension 30 mL  30 mL Oral Q4H PRN Ethelene Hal, NP      . atenolol (TENORMIN) tablet 50 mg  50 mg Oral Daily Ethelene Hal, NP   50 mg at 05/20/16 0807  . DULoxetine (CYMBALTA) DR capsule 90 mg  90 mg Oral Daily Ethelene Hal, NP   90 mg at 05/20/16 3009  . fluticasone (FLONASE) 50 MCG/ACT nasal spray 2 spray  2 spray Each Nare Daily Ethelene Hal, NP   2 spray at 05/20/16 2330  . hydrOXYzine (ATARAX/VISTARIL) tablet 25 mg  25 mg Oral Q6H PRN Ethelene Hal, NP   25 mg at 05/20/16 0810  . magnesium hydroxide (MILK OF MAGNESIA) suspension 30 mL  30 mL Oral Daily PRN Ethelene Hal, NP      . metFORMIN (GLUCOPHAGE) tablet 1,000 mg  1,000 mg Oral BID WC Ethelene Hal, NP   1,000 mg at 05/20/16 0807  . traZODone (DESYREL) tablet 50 mg  50 mg Oral QHS PRN Ethelene Hal, NP   50 mg at 05/20/16 0003   PTA Medications: Prescriptions Prior to Admission  Medication Sig Dispense Refill Last Dose  . atenolol (TENORMIN) 50 MG tablet Take 50 mg by mouth daily.   05/19/2016 at Unknown time  . benzonatate (TESSALON) 100 MG capsule Take 100 mg by mouth  3 (three) times daily as needed for cough.   05/19/2016 at Unknown time  . DULoxetine (CYMBALTA) 60 MG capsule Take 90 mg by mouth daily.   1 05/19/2016 at Unknown time  . fluticasone (FLONASE) 50 MCG/ACT nasal spray Place 2 sprays into both nostrils daily.   05/19/2016 at Unknown time  . gabapentin (NEURONTIN) 400 MG capsule Take 800 mg by mouth 3 (three) times daily.   05/19/2016 at Unknown time  . hydrOXYzine (VISTARIL) 25 MG capsule Take 25 mg by  mouth 3 (three) times daily.  0 05/19/2016 at Unknown time  . metFORMIN (GLUCOPHAGE) 1000 MG tablet Take 1,000 mg by mouth 2 (two) times daily with a meal.   05/19/2016 at Unknown time  . polycarbophil (FIBERCON) 625 MG tablet Take 625 mg by mouth 2 (two) times daily.   05/19/2016 at Unknown time  . tiZANidine (ZANAFLEX) 4 MG tablet Take 4 mg by mouth 4 (four) times daily.   05/19/2016 at Unknown time    Musculoskeletal: Strength & Muscle Tone: within normal limits Gait & Station: normal Patient leans: N/A  Psychiatric Specialty Exam: Physical Exam  Review of Systems  Constitutional: Negative.   HENT: Negative.   Eyes: Negative.   Respiratory: Negative.   Cardiovascular: Negative.   Gastrointestinal: Positive for nausea. Negative for blood in stool, diarrhea and vomiting.  Genitourinary: Negative.   Musculoskeletal: Positive for back pain, joint pain and myalgias.       Describes history of fibromyalgia  Skin: Negative.   Neurological: Positive for seizures.       States she had an isolated seizure following recent overdose  Endo/Heme/Allergies: Negative.   Psychiatric/Behavioral: Positive for depression and suicidal ideas. The patient is nervous/anxious.   All other systems reviewed and are negative.   Blood pressure 104/69, pulse 94, temperature 98.5 F (36.9 C), temperature source Oral, resp. rate 18, height _0  (1.626 m), weight 164 lb (74.4 kg).Body mass index is 28.15 kg/m.  General Appearance: Well Groomed  Eye Contact:  Good  Speech:  Normal Rate  Volume:  Normal  Mood:  Anxious and Depressed  Affect:  constricted, but does smile briefly at times  Thought Process:  Linear  Orientation:  Full (Time, Place, and Person)  Thought Content:  no hallucinations, no delusions, not internally preoccupied   Suicidal Thoughts:  No denies any current suicidal plan or intention and contracts for safety on the unit  Homicidal Thoughts:  No denies any homicidal ideations    Memory:  recent and remote grossly intact   Judgement:  Fair  Insight:  Present  Psychomotor Activity:  Normal  Concentration:  Concentration: Good and Attention Span: Good  Recall:  Good  Fund of Knowledge:  Good  Language:  Good  Akathisia:  Negative  Handed:  Right  AIMS (if indicated):     Assets:  Desire for Improvement Resilience Social Support  ADL's:  Intact  Cognition:  WNL  Sleep:  Number of Hours: 5.5    Treatment Plan Summary: Daily contact with patient to assess and evaluate symptoms and progress in treatment, Medication management, Plan inpatient treatment  and medications as below  Observation Level/Precautions:  15 minute checks  Laboratory:  TSH, Folate/B12/Vitamin D serum levels, repeat BMP to monitor sodium serum level   Psychotherapy:  Milieu, group therapy  Medications:  We discussed options- patient states she feels current medications are not helping, as she is feeling more depressed in spite of compliance. She reports history of partial but more  significant improvement of mood on a prior Wellbutrin trial, denies side effects. ( Patient aware that Wellbutrin may be associated with increased risk of seizures, reports that possible recent seizure was isolated and directly  related to her overdose- risk versus benefit considerations were made .)  D/C Cymbalta Start Celexa 10 mgrs QDAY for depression and anxiety Restart Wellbutrin XL 150 mgrs QAM for depression Continue Neurontin 800 mgrs TID for pain, anxiety Continue Trazodone 50 mgrs QHS PRN for insomnia Start Ativan 0.5 mgrs Q 6 hours PRN for anxiety as needed   Consultations:    Discharge Concerns:    Estimated LOS:  Other:     Physician Treatment Plan for Primary Diagnosis:  MDD, Recurrent, Severe Long Term Goal(s): Improvement in symptoms so as ready for discharge  Short Term Goals: Ability to verbalize feelings will improve, Ability to disclose and discuss suicidal ideas, Ability to demonstrate  self-control will improve, Ability to identify and develop effective coping behaviors will improve and Ability to maintain clinical measurements within normal limits will improve  Physician Treatment Plan for Secondary Diagnosis: Active Problems:   MDD (major depressive disorder), recurrent severe, without psychosis ([place])  Long Term Goal(s): Improvement in symptoms so as ready for discharge  Short Term Goals: Ability to verbalize feelings will improve, Ability to disclose and discuss suicidal ideas, Ability to demonstrate self-control will improve, Ability to identify and develop effective coping behaviors will improve and Ability to maintain clinical measurements within normal limits will improve  I certify that inpatient services furnished can reasonably be expected to improve the patient's condition.    Neita Garnet, MD 12/15/20179:20 AM

## 2016-05-20 NOTE — [facility] Counselor (Signed)
Adult Comprehensive Assessment  Patient ID: Betty Osborne, female   DOB: 27-Oct-1970, 45 y.o.   MRN: [id number]  Information Source: Information source: Patient  Current Stressors:  Educational / Learning stressors: None reported Employment / Job issues: Pt is unemployed by choice; stays at home and is involved with her son's activities Family Relationships: Father is sick and she is primary Programme researcher, broadcasting/film/videocaretaker Financial / Lack of resources (include bankruptcy): None reported Housing / Lack of housing: None reported Physical health (include injuries & life threatening diseases): None reported Social relationships: None reported Substance abuse: pt denies Bereavement / Loss: Pt's mother passed away 2 years ago  Living/Environment/Situation:  Living Arrangements: Spouse/significant other, Children Living conditions (as described by patient or guardian): safe and stable How long has patient lived in current situation?: 20 years What is atmosphere in current home: Comfortable, Supportive  Family History:  Marital status: Married Number of Years Married: 25 What types of issues is patient dealing with in the relationship?: good relationship with husband Does patient have children?: Yes How many children?: 2 How is patient's relationship with their children?: 2 sons; great relationship with two sons  Childhood History:  By whom was/is the patient raised?: Both parents Description of patient's relationship with caregiver when they were a child: great relationship with parents Patient's description of current relationship with people who raised him/her: mother is deceased; good relationship with father Does patient have siblings?: No Did patient suffer any verbal/emotional/physical/sexual abuse as a child?: No Did patient suffer from severe childhood neglect?: No Has patient ever been sexually abused/assaulted/raped as an adolescent or adult?: No Was the patient ever a victim of a crime or a  disaster?: No Witnessed domestic violence?: No Has patient been effected by domestic violence as an adult?: No  Education:  Highest grade of school patient has completed: 12th; Engineer, drillingcosmetology license; some college Currently a Consulting civil engineerstudent?: No Learning disability?: No  Employment/Work Situation:   Employment situation: Unemployed Patient's job has been impacted by current illness: No What is the longest time patient has a held a job?: 15 years 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  Has patient ever been in the Eli Lilly and Companymilitary?: No Has patient ever served in combat?: No Did You Receive Any Psychiatric Treatment/Services While in Equities traderthe Military?: No Are There Guns or Other Weapons in Your Home?: No Are These ComptrollerWeapons Safely Secured?: No Who Could Verify You Are Able To Have These Secured:: husband  Surveyor, quantityinancial Resources:   Financial resources: Income from spouse, Medicaid Does patient have a representative payee or guardian?: No  Alcohol/Substance Abuse:   What has been your use of drugs/alcohol within the last 12 months?: Pt denies If attempted suicide, did drugs/alcohol play a role in this?: No Alcohol/Substance Abuse Treatment Hx: Denies past history Has alcohol/substance abuse ever caused legal problems?: No  Social Support System:   Describe Community Support System: husband, sons, other family and friends Type of faith/religion: [employer]ri[employer] KnucklesChristian How does patient's faith help to cope with current illness?: "good"; has been seeking out Network engineerChristian Counseling  Leisure/Recreation:   Leisure and Hobbies: spending time with sons; color, reading  Strengths/Needs:   What things does the patient do well?: cooking, good mom, good hair dresser In what areas does patient struggle / problems for patient: self-esteem, self-confidence  Discharge Plan:   Does patient have access to transportation?: Yes Will patient be returning to same living situation after discharge?: Yes Currently  receiving community mental health services: No If no, would patient like  referral for services when discharged?: Yes (What county?) Does patient have financial barriers related to discharge medications?: No  Summary/Recommendations:     Patient is a 45 year old female with a diagnosis of Major Depressive Disorder. Pt presented to the hospital with thoughts of suicide and increased depression. Pt reports primary trigger(s) for admission include medication changes, caretaking for her father, increasing anxiety, and unresolved grief. Patient will benefit from crisis stabilization, medication evaluation, group therapy and psycho education in addition to case management for discharge planning. At discharge it is recommended that Pt remain compliant with established discharge plan and continued treatment.   Verdene LennertLauren C Vaishnavi Dalby. 05/20/2016

## 2016-05-20 NOTE — [facility] Group Notes (Signed)
[facility] LCSW Group Therapy 05/20/2016 1:15pm  Type of Therapy: Group Therapy- Feelings Around Relapse and Recovery  Pt did not attend, declined invitation.   Vernie ShanksLauren Meryl Ponder, LCSW [phone] 05/20/2016 3:17 PM

## 2016-05-20 NOTE — Progress Notes (Signed)
[facility] Group Notes:  (Nursing/MHT/Case Management/Adjunct)  Date:  05/20/2016  Time:  12:03 PM  Type of Therapy:  Psychoeducational Skills  Participation Level:  Active  Participation Quality:  Appropriate  Affect:  Appropriate  Cognitive:  Appropriate  Insight:  Appropriate  Engagement in Group:  Engaged  Modes of Intervention:  Activity  Summary of Progress/Problems:  Margy ClarksCook, Burr Soffer D 05/20/2016, 12:03 PM

## 2016-05-20 NOTE — Progress Notes (Signed)
DAR NOTE: Patient presents with anxious affect and depressed mood.  Denies auditory and visual hallucinations.  Described energy level as low and concentration as poor.  Rates depression at 10, hopelessness at 5, and anxiety at 10.  Maintained on routine safety checks.  Reports withdrawal symptoms of cramping and nausea on self inventory form.  Medications given as prescribed.  Support and encouragement offered as needed.  States goal for today is "my depression, anxiety, and pain."  Patient was visible in milieu for activities and therapy.  Patient requested and received Tylenol 650 mg for generalized discomfort, Ativan 0.5mg  and Vistaril 25 mg for anxiety with good effect.

## 2016-05-20 NOTE — [facility] Suicide Risk Assessment (Signed)
[facility]   Nursing information obtained from:  Patient Demographic factors:  Caucasian, Unemployed, Access to firearms Current Mental Status:  Self-harm thoughts Loss Factors:  Loss of significant relationship, Decline in physical health, Financial problems / change in socioeconomic status Historical Factors:  Prior suicide attempts, Family history of mental illness or substance abuse, Impulsivity Risk Reduction Factors:  Living with another person, especially a relative, Positive social support  Total Time spent with patient: 45 minutes Principal Problem:  Major Depression Diagnosis:   Patient Active Problem List   Diagnosis Date Noted  . MDD (major depressive disorder), recurrent severe, without psychosis (HCC) [F33.2] 05/19/2016     Continued Clinical Symptoms:  Alcohol Use Disorder Identification Test Final Score (AUDIT): 8 The "Alcohol Use Disorders Identification Test", Guidelines for Use in Primary Care, Second Edition.  World Science writerHealth Organization [facility](WHO). Score between 0-7:  no or low risk or alcohol related problems. Score between 8-15:  moderate risk of alcohol related problems. Score between 16-19:  high risk of alcohol related problems. Score 20 or above:  warrants further diagnostic evaluation for alcohol dependence and treatment.   CLINICAL FACTORS:  45 year old married female, reports long history of anxiety and depression, recent psychiatric admission about 2 weeks ago for depression and suicidal attempt, was feeling better when discharged home, but has again been feeling more depressed, anxious, in spite of antidepressant medication compliance     Psychiatric Specialty Exam: Physical Exam  ROS  Blood pressure 104/69, pulse 94, temperature 98.5 F (36.9 C), temperature source Oral, resp. rate 18, height 5\' 4"  (1.626 m), weight 164 lb (74.4 kg).Body mass index is 28.15 kg/m.  See admit note MSE                                                          COGNITIVE FEATURES THAT CONTRIBUTE TO RISK:  Decreased level of functioning related to severe mood disorder   SUICIDE RISK:   Moderate:  Frequent suicidal ideation with limited intensity, and duration, some specificity in terms of plans, no associated intent, good self-control, limited dysphoria/symptomatology, some risk factors present, and identifiable protective factors, including available and accessible social support.   PLAN OF CARE: Patient will be admitted to inpatient psychiatric unit for stabilization and safety. Will provide and encourage milieu participation. Provide medication management and maked adjustments as needed.  Will follow daily.    I certify that inpatient services furnished can reasonably be expected to improve the patient's condition.  Nehemiah MassedOBOS, Chasitie Passey, MD 05/20/2016, 1:36 PM

## 2016-05-20 NOTE — Tx Team (Signed)
Initial Treatment Plan 05/20/2016 2:40 AM Betty Osborne WUJ:[id number]    PATIENT STRESSORS: Financial difficulties Health problems Loss of mother  Medication change or noncompliance   PATIENT STRENGTHS: Ability for insight Average or above average intelligence Communication skills Motivation for treatment/growth Supportive family/friends   PATIENT IDENTIFIED PROBLEMS: At risk for suicide  Depression  Anxiety  "Get medications right"  "Function the way I'm suppose to function"             DISCHARGE CRITERIA:  Ability to meet basic life and health needs Improved stabilization in mood, thinking, and/or behavior Motivation to continue treatment in a less acute level of care Need for constant or close observation no longer present Verbal commitment to aftercare and medication compliance  PRELIMINARY DISCHARGE PLAN: Outpatient therapy Return to previous living arrangement  PATIENT/FAMILY INVOLVEMENT: This treatment plan has been presented to and reviewed with the patient, Betty Osborne.  The patient and family have been given the opportunity to ask questions and make suggestions.  Carleene OverlieMiddleton, Zaiyden Strozier P, RN 05/20/2016, 2:40 AM

## 2016-05-20 NOTE — Progress Notes (Signed)
Admission Note:  45 year old female who presents voluntary, in no acute distress, for the treatment of Depression and Anxiety. Patient appears anxious and depressed. Patient was cooperative with admission process. Patient presents with passive SI and contracts for safety upon admission. Patient denies AVH.  [facility] an overdose on Flexeril, Ativan, and alcohol.  Patient reports that since she has been home she has been feeling "overwhelmed" and has been having increased panic attacks and depression.  Patient reports that she has been in the bed for the past 3 days and has only been sleeping 3-4 hours per night for the past 4 days.  Patient reports that she needed to follow up with a  Psychiatrist and thought inpatient admission would be a way to regulate her medications and get linked up with a psychiatrist.  Patient reports that she feels like she is going "down hill" and, though she is not currently suicidal, she felt herself going in that direction.  Patient reports that [facility] the doctors took her off of most of her medications so when she got home she no longer had her "safety net".  Patient identifies additional stressors of her mother dying last year and being primary caregiver of her father.  Patient currently lives with her husband and 911 year old son and identifies her family as her support system.  Patient reports medical hx of Fibromyalgia and IBS.  [facility] patient would like to "Get medications right" and "function the way I'm suppose to function".  Skin was assessed and found to be clear of any abnormal marks.  Patient searched and no contraband found, POC and unit policies explained and understanding verbalized. Consents obtained. Food and fluids offered, and fluids accepted. Patient had no additional questions or concerns.

## 2016-05-20 NOTE — Progress Notes (Signed)
Recreation Therapy Notes  Date: 05/20/16 Time: 0930 Location: 300 Hall Dayroom  Group Topic: Stress Management  Goal Area(s) Addresses:  Patient will verbalize importance of using healthy stress management.  Patient will identify positive emotions associated with healthy stress management.   Intervention: Stress Management  Activity :  Progressive muscle relaxation.  LRT introduced the stress management technique of progressive muscle relaxation.  LRT read Osborne script which allowed patients to engage in the activity by tensing up each muscle group individually and then relaxing.  Patients were to follow along as LRT read script to fully participate in the technique.  Education:  Stress Management, Discharge Planning.   Education Outcome: Acknowledges edcuation/In group clarification offered/Needs additional education  Clinical Observations/Feedback: Pt did not attend group.   Betty Osborne, LRT/CTRS         Betty Osborne, Betty Osborne 05/20/2016 11:08 AM

## 2016-05-20 NOTE — Tx Team (Signed)
Interdisciplinary Treatment and Diagnostic Plan Update  05/20/2016 Time of Session: [place] MRN: [id number]  Principal Diagnosis: <principal problem not specified>  Secondary Diagnoses: Active Problems:   MDD (major depressive disorder), recurrent severe, without psychosis ([place])   Current Medications:  Current Facility-Administered Medications  Medication Dose Route Frequency Provider Last Rate Last Dose  . acetaminophen (TYLENOL) tablet 650 mg  650 mg Oral Q6H PRN Ethelene Hal, NP   650 mg at 05/20/16 1151  . alum & mag hydroxide-simeth (MAALOX/MYLANTA) 200-200-20 MG/5ML suspension 30 mL  30 mL Oral Q4H PRN Ethelene Hal, NP      . atenolol (TENORMIN) tablet 50 mg  50 mg Oral Daily Ethelene Hal, NP   50 mg at 05/20/16 6578  . buPROPion (WELLBUTRIN XL) 24 hr tablet 150 mg  150 mg Oral Daily Myer Peer Cobos, MD   150 mg at 05/20/16 1150  . [START ON 05/21/2016] citalopram (CELEXA) tablet 10 mg  10 mg Oral Daily Myer Peer Cobos, MD      . fluticasone (FLONASE) 50 MCG/ACT nasal spray 2 spray  2 spray Each Nare Daily Ethelene Hal, NP   2 spray at 05/20/16 [phone]  . gabapentin (NEURONTIN) capsule 800 mg  800 mg Oral TID Jenne Campus, MD   800 mg at 05/20/16 1150  . LORazepam (ATIVAN) tablet 0.5 mg  0.5 mg Oral Q6H PRN Myer Peer Cobos, MD      . magnesium hydroxide (MILK OF MAGNESIA) suspension 30 mL  30 mL Oral Daily PRN Ethelene Hal, NP      . metFORMIN (GLUCOPHAGE) tablet 1,000 mg  1,000 mg Oral BID WC Ethelene Hal, NP   1,000 mg at 05/20/16 0807  . traZODone (DESYREL) tablet 50 mg  50 mg Oral QHS PRN Ethelene Hal, NP   50 mg at 05/20/16 0003   PTA Medications: Prescriptions Prior to Admission  Medication Sig Dispense Refill Last Dose  . atenolol (TENORMIN) 50 MG tablet Take 50 mg by mouth daily.   05/19/2016 at Unknown time  . benzonatate (TESSALON) 100 MG capsule Take 100 mg by mouth 3 (three) times daily as  needed for cough.   05/19/2016 at Unknown time  . DULoxetine (CYMBALTA) 60 MG capsule Take 90 mg by mouth daily.   1 05/19/2016 at Unknown time  . fluticasone (FLONASE) 50 MCG/ACT nasal spray Place 2 sprays into both nostrils daily.   05/19/2016 at Unknown time  . gabapentin (NEURONTIN) 400 MG capsule Take 800 mg by mouth 3 (three) times daily.   05/19/2016 at Unknown time  . hydrOXYzine (VISTARIL) 25 MG capsule Take 25 mg by mouth 3 (three) times daily.  0 05/19/2016 at Unknown time  . metFORMIN (GLUCOPHAGE) 1000 MG tablet Take 1,000 mg by mouth 2 (two) times daily with a meal.   05/19/2016 at Unknown time  . polycarbophil (FIBERCON) 625 MG tablet Take 625 mg by mouth 2 (two) times daily.   05/19/2016 at Unknown time  . tiZANidine (ZANAFLEX) 4 MG tablet Take 4 mg by mouth 4 (four) times daily.   05/19/2016 at Unknown time    Patient Stressors: Financial difficulties Health problems Loss of mother  Medication change or noncompliance  Patient Strengths: Ability for insight Average or above average intelligence Communication skills Motivation for treatment/growth Supportive family/friends  Treatment Modalities: Medication Management, Group therapy, Case management,  1 to 1 session with clinician, Psychoeducation, Recreational therapy.   Physician Treatment Plan for Primary Diagnosis: <  principal problem not specified> Long Term Goal(s): Improvement in symptoms so as ready for discharge Improvement in symptoms so as ready for discharge   Short Term Goals: Ability to verbalize feelings will improve Ability to disclose and discuss suicidal ideas Ability to demonstrate self-control will improve Ability to identify and develop effective coping behaviors will improve Ability to maintain clinical measurements within normal limits will improve Ability to verbalize feelings will improve Ability to disclose and discuss suicidal ideas Ability to demonstrate self-control will improve Ability to  identify and develop effective coping behaviors will improve Ability to maintain clinical measurements within normal limits will improve  Medication Management: Evaluate patient's response, side effects, and tolerance of medication regimen.  Therapeutic Interventions: 1 to 1 sessions, Unit Group sessions and Medication administration.  Evaluation of Outcomes: Not Met  Physician Treatment Plan for Secondary Diagnosis: Active Problems:   MDD (major depressive disorder), recurrent severe, without psychosis ([place])  Long Term Goal(s): Improvement in symptoms so as ready for discharge Improvement in symptoms so as ready for discharge   Short Term Goals: Ability to verbalize feelings will improve Ability to disclose and discuss suicidal ideas Ability to demonstrate self-control will improve Ability to identify and develop effective coping behaviors will improve Ability to maintain clinical measurements within normal limits will improve Ability to verbalize feelings will improve Ability to disclose and discuss suicidal ideas Ability to demonstrate self-control will improve Ability to identify and develop effective coping behaviors will improve Ability to maintain clinical measurements within normal limits will improve     Medication Management: Evaluate patient's response, side effects, and tolerance of medication regimen.  Therapeutic Interventions: 1 to 1 sessions, Unit Group sessions and Medication administration.  Evaluation of Outcomes: Not Met   RN Treatment Plan for Primary Diagnosis: <principal problem not specified> Long Term Goal(s): Knowledge of disease and therapeutic regimen to maintain health will improve  Short Term Goals: Ability to disclose and discuss suicidal ideas, Ability to identify and develop effective coping behaviors will improve and Compliance with prescribed medications will improve  Medication Management: RN will administer medications as ordered by provider,  will assess and evaluate patient's response and provide education to patient for prescribed medication. RN will report any adverse and/or side effects to prescribing provider.  Therapeutic Interventions: 1 on 1 counseling sessions, Psychoeducation, Medication administration, Evaluate responses to treatment, Monitor vital signs and CBGs as ordered, Perform/monitor CIWA, COWS, AIMS and Fall Risk screenings as ordered, Perform wound care treatments as ordered.  Evaluation of Outcomes: Not Met   LCSW Treatment Plan for Primary Diagnosis: <principal problem not specified> Long Term Goal(s): Safe transition to appropriate next level of care at discharge, Engage patient in therapeutic group addressing interpersonal concerns.  Short Term Goals: Engage patient in aftercare planning with referrals and resources, Facilitate acceptance of mental health diagnosis and concerns and Increase skills for wellness and recovery  Therapeutic Interventions: Assess for all discharge needs, 1 to 1 time with Social worker, Explore available resources and support systems, Assess for adequacy in community support network, Educate family and significant other(s) on suicide prevention, Complete Psychosocial Assessment, Interpersonal group therapy.  Evaluation of Outcomes: Not Met   Progress in Treatment: Attending groups: No. Participating in groups: No. Taking medication as prescribed: Yes. Toleration medication: Yes. Family/Significant other contact made: Yes, individual(s) contacted:  Annia Belt Patient understands diagnosis: Yes. Discussing patient identified problems/goals with staff: Yes. Medical problems stabilized or resolved: Yes. Denies suicidal/homicidal ideation: No. Issues/concerns per patient self-inventory: No. Other: none  New problem(s) identified: No, Describe:  none  New Short Term/Long Term Goal(s):none  Discharge Plan or Barriers: Pt will participate in outpt treatment upon  discharge.  Reason for Continuation of Hospitalization: Depression Suicidal ideation  Estimated Length of Stay:  Attendees: Patient: 05/20/2016   Physician: Dr Parke Poisson 05/20/2016   Nursing: Comer Locket, RN, Larrie Kass, RN 05/20/2016   RN Care Manager: 05/20/2016   Social Worker: Hanley Seamen 05/20/2016   Recreational Therapist:  05/20/2016   Other: Agustina Caroli, NP, Samuel Jester, NP 05/20/2016   Other:  05/20/2016   Other: 05/20/2016     Scribe for Treatment Team: Joanne Chars, Counselor 05/20/2016 3:29 PM

## 2016-05-20 NOTE — Progress Notes (Signed)
Patient ID: Betty Osborne, female   DOB: 06/14/1970, 45 y.o.   MRN: [id number] D: Client visible on the unit, has visitors tonight(father and husband). "I've had a good day" Client"s goal: "just to get through the day without thoughts of harming myself" "we made each other laugh, met my goal" anxiety "4" depression "5" of 10. "I came here to get my medicine straight" A: Writer provided emotional support, encouraged client to report any concerns. Medications reviewed, administered as ordered. Staff will monitor q15min for safety. R: Client is safe on the unit, attended group.  

## 2016-05-20 NOTE — [facility] Suicide Risk Assessment (Signed)
[facility] INPATIENT:  Family/Significant Other Suicide Prevention Education  Suicide Prevention Education:  Education Completed; Raquel JamesDwayne Mcree, Pt's husband [phone],  (name of family member/significant other) has been identified by the patient as the family member/significant other with whom the patient will be residing, and identified as the person(s) who will aid the patient in the event of a mental health crisis (suicidal ideations/suicide attempt).  With written consent from the patient, the family member/significant other has been provided the following suicide prevention education, prior to the and/or following the discharge of the patient.  The suicide prevention education provided includes the following:  Suicide risk factors  Suicide prevention and interventions  National Suicide Hotline telephone number  [facility] assessment telephone number  [facility] Emergency Assistance 911  [facility] and/or Residential Mobile Crisis Unit telephone number  Request made of family/significant other to:  Remove weapons (e.g., guns, rifles, knives), all items previously/currently identified as safety concern.    Remove drugs/medications (over-the-counter, prescriptions, illicit drugs), all items previously/currently identified as a safety concern.  The family member/significant other verbalizes understanding of the suicide prevention education information provided.  The family member/significant other agrees to remove the items of safety concern listed above.  Verdene LennertLauren C Dawit Tankard 05/20/2016, 1:10 PM

## 2016-05-21 DIAGNOSIS — Z87891 Personal history of nicotine dependence: Secondary | ICD-10-CM

## 2016-05-21 LAB — TSH: TSH: 1.416 u[IU]/mL (ref 0.350–4.500)

## 2016-05-21 LAB — BASIC METABOLIC PANEL
Anion gap: 8 (ref 5–15)
BUN: 16 mg/dL (ref 6–20)
CO2: 30 mmol/L (ref 22–32)
CREATININE: 0.85 mg/dL (ref 0.44–1.00)
Calcium: 9.2 mg/dL (ref 8.9–10.3)
Chloride: 101 mmol/L (ref 101–111)
GFR calc Af Amer: >60 mL/min (ref >60)
GLUCOSE: 100 mg/dL — AB (ref 65–99)
Potassium: 4 mmol/L (ref 3.5–5.1)
SODIUM: 139 mmol/L (ref 135–145)

## 2016-05-21 LAB — FOLATE: Folate: 20.9 ng/mL (ref >5.9)

## 2016-05-21 LAB — VITAMIN B12: Vitamin B-12: 411 pg/mL (ref 180–914)

## 2016-05-21 MED ORDER — TRAMADOL HCL 50 MG PO TABS
50.0000 mg | ORAL_TABLET | Freq: Once | ORAL | Status: AC
  Administered 2016-05-21: 50 mg via ORAL
  Filled 2016-05-21: qty 1

## 2016-05-21 MED ORDER — ONDANSETRON HCL 4 MG PO TABS
4.0000 mg | ORAL_TABLET | Freq: Three times a day (TID) | ORAL | Status: DC | PRN
  Administered 2016-05-21 – 2016-05-22 (×3): 4 mg via ORAL
  Filled 2016-05-21 (×2): qty 1

## 2016-05-21 MED ORDER — TIZANIDINE HCL 4 MG PO TABS
4.0000 mg | ORAL_TABLET | Freq: Once | ORAL | Status: AC
  Administered 2016-05-21: 4 mg via ORAL
  Filled 2016-05-21: qty 2
  Filled 2016-05-21: qty 1

## 2016-05-21 NOTE — Progress Notes (Signed)
[facility] MD Progress Note  05/21/2016 11:47 AM Betty Osborne  MRN:  [id number] Subjective: Patient reports " I ve been nauseas since this morning." reports Hx of IBS. States last BM was 4 day ago.'  Objective: Betty Osborne is awake, alert and oriented *3, seen standing the nursing station interacting with peers.  Denies suicidal or homicidal ideation. Denies auditory or visual hallucination and does not appear to be responding to internal stimuli. Patient interacts well with staff and others. Patient reports he is medication compliant without mediation side effects.  States her depression 5/10. Reports good appetite and was able to rest on last night.  . Support, encouragement and reassurance was provided.   Principal Problem: MDD (major depressive disorder), recurrent severe, without psychosis ([place]) Diagnosis:   Patient Active Problem List   Diagnosis Date Noted  . MDD (major depressive disorder), recurrent severe, without psychosis ([place]) [F33.2] 05/19/2016   Total Time spent with patient: 30 minutes  Past Psychiatric History:   Past Medical History:  Past Medical History:  Diagnosis Date  . Anxiety and depression   . Fibromyalgia   . IBS (irritable bowel syndrome)   . Overdose   . Tachycardia     Past Surgical History:  Procedure Laterality Date  . APPENDECTOMY    . CHOLECYSTECTOMY     Family History:  Family History  Problem Relation Age of Onset  . Heart disease Father     had a heart transplant in 2008   Family Psychiatric  History:  Social History:  History  Alcohol use Not on file    Comment: former     History  Drug Use No    Social History   Social History  . Marital status: Married    Spouse name: N/A  . Number of children: N/A  . Years of education: N/A   Social History Main Topics  . Smoking status: Former Research scientist (life sciences)  . Smokeless tobacco: Never Used  . Alcohol use None     Comment: former  . Drug use: No  . Sexual activity: Not Asked   Other  Topics Concern  . None   Social History Narrative  . None   Additional Social History:                         Sleep: Fair  Appetite:  Fair  Current Medications: Current Facility-Administered Medications  Medication Dose Route Frequency Provider Last Rate Last Dose  . acetaminophen (TYLENOL) tablet 650 mg  650 mg Oral Q6H PRN Ethelene Hal, NP   650 mg at 05/20/16 1151  . alum & mag hydroxide-simeth (MAALOX/MYLANTA) 200-200-20 MG/5ML suspension 30 mL  30 mL Oral Q4H PRN Ethelene Hal, NP      . atenolol (TENORMIN) tablet 50 mg  50 mg Oral Daily Ethelene Hal, NP   50 mg at 05/21/16 0842  . buPROPion (WELLBUTRIN XL) 24 hr tablet 150 mg  150 mg Oral Daily Jenne Campus, MD   150 mg at 05/21/16 0842  . citalopram (CELEXA) tablet 10 mg  10 mg Oral Daily Jenne Campus, MD   10 mg at 05/21/16 0841  . fluticasone (FLONASE) 50 MCG/ACT nasal spray 2 spray  2 spray Each Nare Daily Ethelene Hal, NP   2 spray at 05/21/16 0840  . gabapentin (NEURONTIN) capsule 800 mg  800 mg Oral TID Jenne Campus, MD   800 mg at 05/21/16 0842  . LORazepam (  ATIVAN) tablet 0.5 mg  0.5 mg Oral Q6H PRN Craige Cotta, MD   0.5 mg at 05/21/16 0845  . magnesium hydroxide (MILK OF MAGNESIA) suspension 30 mL  30 mL Oral Daily PRN Laveda Abbe, NP      . metFORMIN (GLUCOPHAGE) tablet 1,000 mg  1,000 mg Oral BID WC Laveda Abbe, NP   1,000 mg at 05/21/16 0841  . ondansetron (ZOFRAN) tablet 4 mg  4 mg Oral Q8H PRN Oneta Rack, NP      . traZODone (DESYREL) tablet 50 mg  50 mg Oral QHS PRN Laveda Abbe, NP   50 mg at 05/20/16 2212    Lab Results:  Results for orders placed or performed during the hospital encounter of 05/19/16 (from the past 48 hour(s))  Basic metabolic panel     Status: Abnormal   Collection Time: 05/21/16  6:17 AM  Result Value Ref Range   Sodium 139 135 - 145 mmol/L   Potassium 4.0 3.5 - 5.1 mmol/L   Chloride 101 101 - 111  mmol/L   CO2 30 22 - 32 mmol/L   Glucose, Bld 100 (H) 65 - 99 mg/dL   BUN 16 6 - 20 mg/dL   Creatinine, Ser 4.31 0.44 - 1.00 mg/dL   Calcium 9.2 8.9 - 54.0 mg/dL   GFR calc non Af Amer >60 >60 mL/min   GFR calc Af Amer >60 >60 mL/min    Comment: (NOTE) The eGFR has been calculated using the CKD EPI equation. This calculation has not been validated in all clinical situations. eGFR's persistently <60 mL/min signify possible Chronic Kidney Disease.    Anion gap 8 5 - 15    Comment: Performed at [facility]  TSH     Status: None   Collection Time: 05/21/16  6:17 AM  Result Value Ref Range   TSH 1.416 0.350 - 4.500 uIU/mL    Comment: Performed by a 3rd Generation assay with a functional sensitivity of <=0.01 uIU/mL. Performed at [facility]   Folate     Status: None   Collection Time: 05/21/16  6:17 AM  Result Value Ref Range   Folate 20.9 >5.9 ng/mL    Comment: Performed at [facility]  Vitamin B12     Status: None   Collection Time: 05/21/16  6:17 AM  Result Value Ref Range   Vitamin B-12 411 180 - 914 pg/mL    Comment: (NOTE) This assay is not validated for testing neonatal or myeloproliferative syndrome specimens for Vitamin B12 levels. Performed at [facility]     Blood Alcohol level:  Lab Results  Component Value Date   [facility] <5 05/19/2016    Metabolic Disorder Labs: No results found for: HGBA1C, MPG No results found for: PROLACTIN No results found for: CHOL, TRIG, HDL, CHOLHDL, VLDL, LDLCALC  Physical Findings: AIMS: Facial and Oral Movements Muscles of Facial Expression: None, normal Lips and Perioral Area: None, normal Jaw: None, normal Tongue: None, normal,Extremity Movements Upper (arms, wrists, hands, fingers): None, normal Lower (legs, knees, ankles, toes): None, normal, Trunk Movements Neck, shoulders, hips: None, normal, Overall Severity Severity of abnormal movements (highest score from questions  above): None, normal Incapacitation due to abnormal movements: None, normal Patient's awareness of abnormal movements (rate only patient's report): No Awareness, Dental Status Current problems with teeth and/or dentures?: No Does patient usually wear dentures?: No  CIWA:    COWS:     Musculoskeletal: Strength &  Muscle Tone: within normal limits Gait & Station: normal Patient leans: N/A  Psychiatric Specialty Exam: Physical Exam  Nursing note and vitals reviewed. Constitutional: She is oriented to person, place, and time. She appears well-developed.  Cardiovascular: Normal rate.   Neurological: She is alert and oriented to person, place, and time.  Psychiatric: She has a normal mood and affect. Her behavior is normal.    Review of Systems  Psychiatric/Behavioral: Positive for depression. Negative for suicidal ideas. The patient is nervous/anxious.     Blood pressure 107/63, pulse 86, temperature 98.2 F (36.8 C), resp. rate 18, height '5\' 4"'$  (1.626 m), weight 74.4 kg (164 lb).Body mass index is 28.15 kg/m.  General Appearance: Casual  Eye Contact:  Good  Speech:  Clear and Coherent  Volume:  Normal  Mood:  Depressed  Affect:  Congruent  Thought Process:  Coherent  Orientation:  Full (Time, Place, and Person)  Thought Content:  Hallucinations: None  Suicidal Thoughts:  No  Homicidal Thoughts:  No  Memory:  Immediate;   Fair Recent;   Fair Remote;   Fair  Judgement:  Fair  Insight:  Fair  Psychomotor Activity:  Restlessness  Concentration:  Concentration: Fair  Recall:  [employer] of Knowledge:  Fair  Language:  Fair  Akathisia:  No  Handed:  Right  AIMS (if indicated):     Assets:  Communication Skills Desire for Improvement Resilience Social Support  ADL's:  Intact  Cognition:  WNL  Sleep:  Number of Hours: 5.5    I agree with current treatment plan on 05/21/2016, Patient seen face-to-face for psychiatric evaluation follow-up, chart reviewed. Reviewed the  information documented and agree with the treatment plan.   Treatment Plan Summary: Daily contact with patient to assess and evaluate symptoms and progress in treatment and Medication management   PRN Zofran 4 mg for nausea/vomitimg Continue with Celexa 10 mgs, Neurontin 800 mg TID, Wellbutrin 150 mg  for mood stabilization. Continue Ativan 0.'5mg'$  PRN for anxiety Continue with Trazodone 50 mg for insomnia Will continue to monitor vitals ,medication compliance and treatment side effects while patient is here.  Reviewed labs BMP-pending results,BAL - , UDS  CSW will start working on disposition.  Patient to participate in therapeutic milieu  Derrill Center, NP 05/21/2016, 11:47 AM

## 2016-05-21 NOTE — Progress Notes (Signed)
D    Pt complained of pain in her back and generalized pain    She interacts appropriately with others and is compliant with treatment    She endorses depression and anxiety A   Verbal support given   Medications administered and effectiveness monitored    Q 15 min checks R    Pt is safe and did get some relief from her pain after taking tramadol

## 2016-05-21 NOTE — [facility] Group Notes (Signed)
Adult Therapy Group Note  Date:  05/21/2016  Time:  9:00-10:00AM  Group Topic/Focus: Unhealthy vs Healthy Coping Techniques  Building Self Esteem:    The focus of this group was to determine what unhealthy coping techniques typically are used or and what healthy coping techniques would be helpful in coping with fears related to the upcoming holidays.  An exercise was used to identify patients' fears about the upcoming holidays.   Patients were guided in becoming aware of the differences between healthy and unhealthy coping techniques  The whiteboard was utilized to record the discussion, and patients recorded healthy coping techniques specific to their expressed fears.  Participation Level:  Active  Participation Quality:  Attentive, Sharing and Supportive  Affect:  Depressed and Tearful  Cognitive:  Appropriate  Insight: Good  Engagement in Group:  Engaged  Modes of Intervention:  Exercise, Discussion and Support  Additional Comments:  The patient expressed that a healthy coping skill she utilizes is coloring, while the unhealthy coping skill she relies on is isolation.  She talked during group about losing her mother who was her best friend, and how she is anxious about having to create a positive experience for her father, rather than show her own pain.  Ambrose MantleMareida Grossman-Orr, LCSW 05/21/2016   1:28 PM

## 2016-05-21 NOTE — [facility] Group Notes (Signed)
[facility] Group Notes:  (Nursing/MHT/Case Management/Adjunct)  Date:  05/21/2016  Time:  2:38 PM  Type of Therapy:  Psychoeducational Skills  Participation Level:  Active  Participation Quality:  Appropriate  Affect:  Appropriate  Cognitive:  Appropriate  Insight:  Appropriate  Engagement in Group:  Engaged  Modes of Intervention:  Discussion  Summary of Progress/Problems: Pt did attend self inventory group and life skills group.     Flossie Wexler Shanta 05/21/2016, 2:38 PM 

## 2016-05-21 NOTE — Plan of Care (Signed)
Problem: Medication: Goal: Compliance with prescribed medication regimen will improve Outcome: Progressing Compliance with prescribed medication regimen will improve AEB client reporting symptoms, taking medications as ordered, reporting any SE.

## 2016-05-22 ENCOUNTER — Inpatient Hospital Stay (HOSPITAL_COMMUNITY)
Admission: EM | Admit: 2016-05-22 | Discharge: 2016-05-24 | DRG: 287 | Disposition: A | Discharge status: Home / Self Care | Payer: Medicaid Other | Source: Ambulatory Visit | Attending: Internal Medicine | Admitting: Internal Medicine

## 2016-05-22 ENCOUNTER — Encounter (HOSPITAL_COMMUNITY): Payer: Self-pay | Admitting: Family Medicine

## 2016-05-22 ENCOUNTER — Inpatient Hospital Stay (HOSPITAL_COMMUNITY): Payer: Medicaid Other

## 2016-05-22 ENCOUNTER — Encounter (HOSPITAL_COMMUNITY): Payer: Self-pay

## 2016-05-22 DIAGNOSIS — Z915 Personal history of self-harm: Secondary | ICD-10-CM

## 2016-05-22 DIAGNOSIS — R072 Precordial pain: Secondary | ICD-10-CM | POA: Diagnosis present

## 2016-05-22 DIAGNOSIS — Z9049 Acquired absence of other specified parts of digestive tract: Secondary | ICD-10-CM

## 2016-05-22 DIAGNOSIS — F332 Major depressive disorder, recurrent severe without psychotic features: Secondary | ICD-10-CM | POA: Diagnosis present

## 2016-05-22 DIAGNOSIS — M797 Fibromyalgia: Secondary | ICD-10-CM | POA: Diagnosis present

## 2016-05-22 DIAGNOSIS — D649 Anemia, unspecified: Secondary | ICD-10-CM | POA: Diagnosis present

## 2016-05-22 DIAGNOSIS — R9439 Abnormal result of other cardiovascular function study: Secondary | ICD-10-CM | POA: Diagnosis present

## 2016-05-22 DIAGNOSIS — F419 Anxiety disorder, unspecified: Secondary | ICD-10-CM | POA: Diagnosis present

## 2016-05-22 DIAGNOSIS — Z87891 Personal history of nicotine dependence: Secondary | ICD-10-CM

## 2016-05-22 DIAGNOSIS — G4700 Insomnia, unspecified: Secondary | ICD-10-CM | POA: Diagnosis present

## 2016-05-22 DIAGNOSIS — K589 Irritable bowel syndrome without diarrhea: Secondary | ICD-10-CM | POA: Diagnosis present

## 2016-05-22 DIAGNOSIS — Z8249 Family history of ischemic heart disease and other diseases of the circulatory system: Secondary | ICD-10-CM

## 2016-05-22 LAB — I-STAT TROPONIN, ED
Troponin i, poc: 0.01 ng/mL (ref 0.00–0.08)
Troponin i, poc: 0 ng/mL (ref 0.00–0.08)

## 2016-05-22 LAB — COMPREHENSIVE METABOLIC PANEL
ALBUMIN: 3.7 g/dL (ref 3.5–5.0)
ALK PHOS: 41 U/L (ref 38–126)
ALT: 50 U/L (ref 14–54)
AST: 50 U/L — AB (ref 15–41)
Anion gap: 9 (ref 5–15)
BILIRUBIN TOTAL: 0.5 mg/dL (ref 0.3–1.2)
BUN: 11 mg/dL (ref 6–20)
CO2: 28 mmol/L (ref 22–32)
CREATININE: 0.9 mg/dL (ref 0.44–1.00)
Calcium: 9.1 mg/dL (ref 8.9–10.3)
Chloride: 101 mmol/L (ref 101–111)
GFR calc Af Amer: >60 mL/min (ref >60)
GLUCOSE: 94 mg/dL (ref 65–99)
Potassium: 4.2 mmol/L (ref 3.5–5.1)
Sodium: 138 mmol/L (ref 135–145)
TOTAL PROTEIN: 6.1 g/dL — AB (ref 6.5–8.1)

## 2016-05-22 LAB — TROPONIN I: Troponin I: <0.03 ng/mL (ref <0.03)

## 2016-05-22 LAB — CBC
HCT: 35.6 % — ABNORMAL LOW (ref 36.0–46.0)
Hemoglobin: 11.7 g/dL — ABNORMAL LOW (ref 12.0–15.0)
MCH: 29.3 pg (ref 26.0–34.0)
MCHC: 32.9 g/dL (ref 30.0–36.0)
MCV: 89.2 fL (ref 78.0–100.0)
Platelets: 247 K/uL (ref 150–400)
RBC: 3.99 MIL/uL (ref 3.87–5.11)
RDW: 13 % (ref 11.5–15.5)
WBC: 7.4 K/uL (ref 4.0–10.5)

## 2016-05-22 LAB — LIPASE, BLOOD: LIPASE: 46 U/L (ref 11–51)

## 2016-05-22 MED ORDER — GABAPENTIN 400 MG PO CAPS
800.0000 mg | ORAL_CAPSULE | Freq: Three times a day (TID) | ORAL | Status: DC
  Administered 2016-05-22 – 2016-05-24 (×5): 800 mg via ORAL
  Filled 2016-05-22 (×5): qty 2

## 2016-05-22 MED ORDER — NITROGLYCERIN 0.4 MG SL SUBL
0.4000 mg | SUBLINGUAL_TABLET | SUBLINGUAL | Status: DC | PRN
  Filled 2016-05-22: qty 1

## 2016-05-22 MED ORDER — HYDROXYZINE HCL 25 MG PO TABS
25.0000 mg | ORAL_TABLET | Freq: Three times a day (TID) | ORAL | Status: DC
  Administered 2016-05-22 – 2016-05-23 (×2): 25 mg via ORAL
  Filled 2016-05-22 (×2): qty 1

## 2016-05-22 MED ORDER — CYCLOBENZAPRINE HCL 10 MG PO TABS: 5.0000 mg | ORAL_TABLET | Freq: Three times a day (TID) | ORAL | Status: DC | PRN

## 2016-05-22 MED ORDER — ALUM & MAG HYDROXIDE-SIMETH 200-200-20 MG/5ML PO SUSP
30.0000 mL | ORAL | Status: DC | PRN
  Administered 2016-05-22 – 2016-05-23 (×2): 30 mL via ORAL
  Filled 2016-05-22 (×2): qty 30

## 2016-05-22 MED ORDER — ASPIRIN EC 81 MG PO TBEC
81.0000 mg | DELAYED_RELEASE_TABLET | Freq: Every day | ORAL | Status: DC
  Administered 2016-05-23 – 2016-05-24 (×3): 81 mg via ORAL
  Filled 2016-05-22 (×3): qty 1

## 2016-05-22 MED ORDER — TRAMADOL HCL 50 MG PO TABS
50.0000 mg | ORAL_TABLET | Freq: Four times a day (QID) | ORAL | Status: DC | PRN
  Administered 2016-05-22 – 2016-05-24 (×4): 50 mg via ORAL
  Filled 2016-05-22 (×4): qty 1

## 2016-05-22 MED ORDER — ASPIRIN EC 81 MG PO TBEC: 81.0000 mg | DELAYED_RELEASE_TABLET | Freq: Every day | ORAL | Status: DC

## 2016-05-22 MED ORDER — NITROGLYCERIN 0.4 MG SL SUBL
0.4000 mg | SUBLINGUAL_TABLET | Freq: Once | SUBLINGUAL | Status: AC
  Administered 2016-05-22: 0.4 mg via SUBLINGUAL
  Filled 2016-05-22: qty 25

## 2016-05-22 MED ORDER — DULOXETINE HCL 60 MG PO CPEP
90.0000 mg | ORAL_CAPSULE | Freq: Every day | ORAL | Status: DC
  Filled 2016-05-22: qty 1

## 2016-05-22 MED ORDER — LORAZEPAM 2 MG/ML IJ SOLN: 1.0000 mg | Freq: Once | INTRAMUSCULAR | Status: DC

## 2016-05-22 MED ORDER — ENOXAPARIN SODIUM 40 MG/0.4ML ~~LOC~~ SOLN
40.0000 mg | SUBCUTANEOUS | Status: DC
  Administered 2016-05-22: 40 mg via SUBCUTANEOUS
  Filled 2016-05-22: qty 0.4

## 2016-05-22 MED ORDER — ACETAMINOPHEN 325 MG PO TABS: 650.0000 mg | ORAL_TABLET | ORAL | Status: DC | PRN

## 2016-05-22 MED ORDER — ACETAMINOPHEN 325 MG PO TABS
650.0000 mg | ORAL_TABLET | ORAL | Status: DC | PRN
Start: 2016-05-22 — End: 2016-05-24
  Administered 2016-05-24: 650 mg via ORAL
  Filled 2016-05-22: qty 2

## 2016-05-22 MED ORDER — ACETAMINOPHEN 325 MG PO TABS
650.0000 mg | ORAL_TABLET | Freq: Once | ORAL | Status: AC
  Administered 2016-05-22: 650 mg via ORAL
  Filled 2016-05-22: qty 2

## 2016-05-22 MED ORDER — ATENOLOL 25 MG PO TABS
50.0000 mg | ORAL_TABLET | Freq: Every day | ORAL | Status: DC
  Filled 2016-05-22: qty 2

## 2016-05-22 MED ORDER — ALPRAZOLAM 0.25 MG PO TABS: 0.2500 mg | ORAL_TABLET | Freq: Two times a day (BID) | ORAL | Status: DC | PRN

## 2016-05-22 MED ORDER — TIZANIDINE HCL 2 MG PO TABS: 4.0000 mg | ORAL_TABLET | Freq: Three times a day (TID) | ORAL | Status: DC | PRN

## 2016-05-22 MED ORDER — ENOXAPARIN SODIUM 40 MG/0.4ML ~~LOC~~ SOLN: 40.0000 mg | SUBCUTANEOUS | Status: DC

## 2016-05-22 MED ORDER — ALPRAZOLAM 0.5 MG PO TABS
0.5000 mg | ORAL_TABLET | Freq: Three times a day (TID) | ORAL | Status: DC | PRN
  Administered 2016-05-23: 0.5 mg via ORAL
  Filled 2016-05-22: qty 1

## 2016-05-22 MED ORDER — NITROGLYCERIN 0.4 MG SL SUBL: 0.4000 mg | SUBLINGUAL_TABLET | SUBLINGUAL | Status: DC | PRN

## 2016-05-22 MED ORDER — TRAZODONE HCL 50 MG PO TABS
50.0000 mg | ORAL_TABLET | Freq: Every day | ORAL | Status: DC
  Administered 2016-05-22 – 2016-05-23 (×2): 50 mg via ORAL
  Filled 2016-05-22 (×2): qty 1

## 2016-05-22 MED ORDER — PNEUMOCOCCAL VAC POLYVALENT 25 MCG/0.5ML IJ INJ: 0.5000 mL | INJECTION | INTRAMUSCULAR | Status: DC | PRN

## 2016-05-22 MED ORDER — ONDANSETRON HCL 4 MG/2ML IJ SOLN: 4.0000 mg | Freq: Four times a day (QID) | INTRAMUSCULAR | Status: DC | PRN

## 2016-05-22 MED ORDER — ONDANSETRON HCL 4 MG/2ML IJ SOLN
4.0000 mg | Freq: Four times a day (QID) | INTRAMUSCULAR | Status: DC | PRN
  Administered 2016-05-23: 4 mg via INTRAVENOUS
  Filled 2016-05-22: qty 2

## 2016-05-22 MED ORDER — KETOROLAC TROMETHAMINE 60 MG/2ML IM SOLN
60.0000 mg | Freq: Once | INTRAMUSCULAR | Status: AC
  Administered 2016-05-22: 60 mg via INTRAMUSCULAR
  Filled 2016-05-22 (×2): qty 2

## 2016-05-22 MED ORDER — BENZONATATE 100 MG PO CAPS: 100.0000 mg | ORAL_CAPSULE | Freq: Three times a day (TID) | ORAL | Status: DC | PRN

## 2016-05-22 MED ORDER — TRAZODONE HCL 50 MG PO TABS: 50.0000 mg | ORAL_TABLET | Freq: Every day | ORAL | Status: DC

## 2016-05-22 NOTE — ED Provider Notes (Signed)
[facility]-EMERGENCY DEPT Provider Note   CSN: [id number] Arrival date & time: 05/22/16  1437     History   Chief Complaint Chief Complaint  Patient presents with  . Chest Pain    HPI Betty Osborne is a 45 y.o. female.  The history is provided by the patient and medical records. No language interpreter was used.  Chest Pain   This is a recurrent (states has had similar episodes in the past and has had to be admitted ) problem. The current episode started 3 to 5 hours ago. The problem occurs constantly. The problem has not changed since onset.The pain is associated with exertion and rest. The pain is present in the substernal region. The pain is moderate. The quality of the pain is described as pressure-like. The pain radiates to the left jaw and left neck. Duration of episode(s) is 4 hours. Associated symptoms include abdominal pain (associates this with chest pain today), exertional chest pressure and shortness of breath. Pertinent negatives include no cough, no diaphoresis, no fever, no headaches and no syncope. She has tried nitroglycerin for the symptoms. The treatment provided mild relief.  Her past medical history is significant for anxiety/panic attacks, diabetes and hypertension.  Pertinent negatives for past medical history include no CAD, no COPD, no CHF and no hyperlipidemia.  Her family medical history is significant for heart disease (states her father had to have heart transplant in 28's due to familial heart condition).  Procedure history is positive for stress echo (states this was normal earlier this year).  Procedure history is negative for cardiac catheterization.    Past Medical History:  Diagnosis Date  . Anxiety and depression   . Fibromyalgia   . IBS (irritable bowel syndrome)   . Overdose   . Tachycardia     Patient Active Problem List   Diagnosis Date Noted  . Chest pain 05/22/2016  . Normocytic anemia 05/22/2016  . Severe episode of recurrent major  depressive disorder, without psychotic features ([facility])   . MDD (major depressive disorder), recurrent severe, without psychosis ([facility]) 05/19/2016    Past Surgical History:  Procedure Laterality Date  . APPENDECTOMY    . CHOLECYSTECTOMY      OB History    No data available       Home Medications    Prior to Admission medications   Medication Sig Start Date End Date Taking? Authorizing Provider  atenolol (TENORMIN) 50 MG tablet Take 50 mg by mouth daily.    Historical Provider, MD  benzonatate (TESSALON) 100 MG capsule Take 100 mg by mouth 3 (three) times daily as needed for cough.    Historical Provider, MD  DULoxetine (CYMBALTA) 60 MG capsule Take 90 mg by mouth daily.  12/04/15   Historical Provider, MD  fluticasone (FLONASE) 50 MCG/ACT nasal spray Place 2 sprays into both nostrils daily.    Historical Provider, MD  gabapentin (NEURONTIN) 400 MG capsule Take 800 mg by mouth 3 (three) times daily.    Historical Provider, MD  hydrOXYzine (VISTARIL) 25 MG capsule Take 25 mg by mouth 3 (three) times daily. 12/07/15   Historical Provider, MD  metFORMIN (GLUCOPHAGE) 1000 MG tablet Take 1,000 mg by mouth 2 (two) times daily with a meal.    Historical Provider, MD  polycarbophil (FIBERCON) 625 MG tablet Take 625 mg by mouth 2 (two) times daily.    Historical Provider, MD  tiZANidine (ZANAFLEX) 4 MG tablet Take 4 mg by mouth 4 (four) times daily.  Historical Provider, MD    Family History Family History  Problem Relation Age of Onset  . Heart disease Father     had a heart transplant in 2008    Social History Social History  Substance Use Topics  . Smoking status: Former Games developermoker  . Smokeless tobacco: Never Used  . Alcohol use Not on file     Comment: former     Allergies   Dilaudid [hydromorphone hcl]   Review of Systems Review of Systems  Constitutional: Negative for diaphoresis and fever.  HENT: Negative for congestion and rhinorrhea.   Respiratory: Positive for  shortness of breath. Negative for cough.   Cardiovascular: Positive for chest pain. Negative for syncope.  Gastrointestinal: Positive for abdominal pain (associates this with chest pain today).  Genitourinary: Negative for dysuria and hematuria.  Neurological: Negative for headaches.  Psychiatric/Behavioral: Negative for agitation, confusion and suicidal ideas.  All other systems reviewed and are negative.    Physical Exam Updated Vital Signs BP 98/61   Pulse 69   Temp 98.3 F (36.8 C) (Oral)   Resp 16   Ht 5\' 4"  (1.626 m)   Wt 72.6 kg   SpO2 98%   BMI 27.46 kg/m   Physical Exam  Constitutional: She is oriented to person, place, and time. She appears well-developed and well-nourished. No distress.  HENT:  Head: Normocephalic and atraumatic.  Right Ear: External ear normal.  Left Ear: External ear normal.  Eyes: Conjunctivae and EOM are normal.  Neck: Normal range of motion. Neck supple.  Cardiovascular: Normal rate, regular rhythm and normal heart sounds.   No murmur heard. Pulmonary/Chest: Effort normal and breath sounds normal. No respiratory distress. She has no wheezes. She has no rales.  Abdominal: Soft. She exhibits no distension. There is tenderness (mild epigastric ttp). There is no guarding.  Musculoskeletal: Normal range of motion. She exhibits no edema.  Neurological: She is alert and oriented to person, place, and time.  Skin: Skin is warm and dry. She is not diaphoretic.  Nursing note and vitals reviewed.    ED Treatments / Results  Labs (all labs ordered are listed, but only abnormal results are displayed) Labs Reviewed  BASIC METABOLIC PANEL - Abnormal; Notable for the following:       Result Value   Glucose, Bld 100 (*)    All other components within normal limits  CBC - Abnormal; Notable for the following:    Hemoglobin 11.7 (*)    HCT 35.6 (*)    All other components within normal limits  COMPREHENSIVE METABOLIC PANEL - Abnormal; Notable for the  following:    Total Protein 6.1 (*)    AST 50 (*)    All other components within normal limits  TSH  FOLATE  VITAMIN B12  LIPASE, BLOOD  VITAMIN D 25 HYDROXY (VIT D DEFICIENCY, FRACTURES)  I-STAT TROPOININ, ED  I-STAT TROPOININ, ED    EKG  EKG Interpretation  Date/Time:  Sunday May 22 2016 14:50:04 EST Ventricular Rate:  80 PR Interval:  136 QRS Duration: 84 QT Interval:  402 QTC Calculation: 463 R Axis:   72 Text Interpretation:  Normal sinus rhythm Normal ECG No old tracing to compare Confirmed by [facility] CenterGLICK  MD, DAVID ([id number]) on 05/22/2016 3:05:49 PM       Radiology Dg Chest Portable 1 View  Result Date: 05/22/2016 CLINICAL DATA:  Chest pain EXAM: PORTABLE CHEST 1 VIEW COMPARISON:  April 24, 2016 FINDINGS: The heart size and mediastinal contours are within  normal limits. Both lungs are clear. The visualized skeletal structures are unremarkable. IMPRESSION: No active disease. Electronically Signed   By: Gerome Samavid  Williams III M.D   On: 05/22/2016 15:58    Procedures Procedures (including critical care time)  Medications Ordered in ED Medications  tiZANidine (ZANAFLEX) tablet 4 mg (4 mg Oral Given 05/21/16 1952)  traMADol (ULTRAM) tablet 50 mg (50 mg Oral Given 05/21/16 2246)  ketorolac (TORADOL) injection 60 mg (60 mg Intramuscular Given 05/22/16 1027)  nitroGLYCERIN (NITROSTAT) SL tablet 0.4 mg (0.4 mg Sublingual Given 05/22/16 1407)  acetaminophen (TYLENOL) tablet 650 mg (650 mg Oral Given 05/22/16 1752)     Initial Impression / Assessment and Plan / ED Course  I have reviewed the triage vital signs and the nursing notes.  Pertinent labs & imaging results that were available during my care of the patient were reviewed by me and considered in my medical decision making (see chart for details).  Clinical Course       This is a 45 year old female who presents today with chest pain that started earlier today. On review her EKG, there is no acute ischemic  changes. She has normal sinus rhythm. No ST elevations or depressions. No pathological T-wave inversions. Her vital signs are within normal limits. No improvement with nitroglycerin tablet. We will not give her any further nitroglycerin given her blood pressure being low. She has several risk factors including diabetes, hypertension, strong family history for heart disease. Given these risk factors I discussed with the hospitalist who is agreeable to admit the patient for ACS rule out. Patient was stable at the time of admission.  Final Clinical Impressions(s) / ED Diagnoses   Final diagnoses:  Chest pain, unspecified type    New Prescriptions Discharge Medication List as of 05/22/2016  8:19 PM       Madolyn FriezeVijay Hennie Gosa, MD 05/23/16 [id number]    Dione Boozeavid Glick, MD 05/23/16 [phone]

## 2016-05-22 NOTE — Progress Notes (Signed)
[facility] MD Progress Note  05/22/2016 10:04 AM Betty Osborne  MRN:  [id number] Subjective: Patient reports " I am having intense back pain after playing basketball."  Objective: Betty Osborne is awake, alert and oriented *3, seen sitting in the dayroom. Denies suicidal or homicidal ideation. Denies auditory or visual hallucination and does not appear to be responding to internal stimuli. Patient interacts well with staff and others. Patient reports he is medication compliant without mediation side effects. Patient reports she takes hydrocodone or for back pain. States her depression 5/10. Reports good appetite and was able to rest on last night. Support, encouragement and reassurance was provided.   Principal Problem: MDD (major depressive disorder), recurrent severe, without psychosis ([place]) Diagnosis:   Patient Active Problem List   Diagnosis Date Noted  . MDD (major depressive disorder), recurrent severe, without psychosis ([place]) [F33.2] 05/19/2016   Total Time spent with patient: 30 minutes  Past Psychiatric History:   Past Medical History:  Past Medical History:  Diagnosis Date  . Anxiety and depression   . Fibromyalgia   . IBS (irritable bowel syndrome)   . Overdose   . Tachycardia     Past Surgical History:  Procedure Laterality Date  . APPENDECTOMY    . CHOLECYSTECTOMY     Family History:  Family History  Problem Relation Age of Onset  . Heart disease Father     had a heart transplant in 2008   Family Psychiatric  History:  Social History:  History  Alcohol use Not on file    Comment: former     History  Drug Use No    Social History   Social History  . Marital status: Married    Spouse name: N/A  . Number of children: N/A  . Years of education: N/A   Social History Main Topics  . Smoking status: Former Research scientist (life sciences)  . Smokeless tobacco: Never Used  . Alcohol use None     Comment: former  . Drug use: No  . Sexual activity: Not Asked   Other Topics  Concern  . None   Social History Narrative  . None   Additional Social History:                         Sleep: Fair  Appetite:  Fair  Current Medications: Current Facility-Administered Medications  Medication Dose Route Frequency Provider Last Rate Last Dose  . acetaminophen (TYLENOL) tablet 650 mg  650 mg Oral Q6H PRN Ethelene Hal, NP   650 mg at 05/21/16 1952  . alum & mag hydroxide-simeth (MAALOX/MYLANTA) 200-200-20 MG/5ML suspension 30 mL  30 mL Oral Q4H PRN Ethelene Hal, NP      . atenolol (TENORMIN) tablet 50 mg  50 mg Oral Daily Ethelene Hal, NP   50 mg at 05/22/16 0841  . buPROPion (WELLBUTRIN XL) 24 hr tablet 150 mg  150 mg Oral Daily Jenne Campus, MD   150 mg at 05/22/16 0841  . citalopram (CELEXA) tablet 10 mg  10 mg Oral Daily Jenne Campus, MD   10 mg at 05/22/16 0841  . cyclobenzaprine (FLEXERIL) tablet 5 mg  5 mg Oral TID PRN Derrill Center, NP      . fluticasone (FLONASE) 50 MCG/ACT nasal spray 2 spray  2 spray Each Nare Daily Ethelene Hal, NP   2 spray at 05/22/16 0841  . gabapentin (NEURONTIN) capsule 800 mg  800 mg Oral TID  Jenne Campus, MD   800 mg at 05/22/16 0841  . ketorolac (TORADOL) injection 60 mg  60 mg Intramuscular Once Derrill Center, NP      . LORazepam (ATIVAN) tablet 0.5 mg  0.5 mg Oral Q6H PRN Jenne Campus, MD   0.5 mg at 05/21/16 2246  . magnesium hydroxide (MILK OF MAGNESIA) suspension 30 mL  30 mL Oral Daily PRN Ethelene Hal, NP      . metFORMIN (GLUCOPHAGE) tablet 1,000 mg  1,000 mg Oral BID WC Ethelene Hal, NP   1,000 mg at 05/22/16 0841  . ondansetron (ZOFRAN) tablet 4 mg  4 mg Oral Q8H PRN Derrill Center, NP   4 mg at 05/21/16 1208  . traZODone (DESYREL) tablet 50 mg  50 mg Oral QHS PRN Ethelene Hal, NP   50 mg at 05/21/16 2246    Lab Results:  Results for orders placed or performed during the hospital encounter of 05/19/16 (from the past 48 hour(s))  Basic  metabolic panel     Status: Abnormal   Collection Time: 05/21/16  6:17 AM  Result Value Ref Range   Sodium 139 135 - 145 mmol/L   Potassium 4.0 3.5 - 5.1 mmol/L   Chloride 101 101 - 111 mmol/L   CO2 30 22 - 32 mmol/L   Glucose, Bld 100 (H) 65 - 99 mg/dL   BUN 16 6 - 20 mg/dL   Creatinine, Ser 0.85 0.44 - 1.00 mg/dL   Calcium 9.2 8.9 - 10.3 mg/dL   GFR calc non Af Amer >60 >60 mL/min   GFR calc Af Amer >60 >60 mL/min    Comment: (NOTE) The eGFR has been calculated using the CKD EPI equation. This calculation has not been validated in all clinical situations. eGFR's persistently <60 mL/min signify possible Chronic Kidney Disease.    Anion gap 8 5 - 15    Comment: Performed at [facility]  TSH     Status: None   Collection Time: 05/21/16  6:17 AM  Result Value Ref Range   TSH 1.416 0.350 - 4.500 uIU/mL    Comment: Performed by a 3rd Generation assay with a functional sensitivity of <=0.01 uIU/mL. Performed at [facility]   Folate     Status: None   Collection Time: 05/21/16  6:17 AM  Result Value Ref Range   Folate 20.9 >5.9 ng/mL    Comment: Performed at [facility]  Vitamin B12     Status: None   Collection Time: 05/21/16  6:17 AM  Result Value Ref Range   Vitamin B-12 411 180 - 914 pg/mL    Comment: (NOTE) This assay is not validated for testing neonatal or myeloproliferative syndrome specimens for Vitamin B12 levels. Performed at [facility]     Blood Alcohol level:  Lab Results  Component Value Date   [facility] <5 32/95/1884    Metabolic Disorder Labs: No results found for: HGBA1C, MPG No results found for: PROLACTIN No results found for: CHOL, TRIG, HDL, CHOLHDL, VLDL, LDLCALC  Physical Findings: AIMS: Facial and Oral Movements Muscles of Facial Expression: None, normal Lips and Perioral Area: None, normal Jaw: None, normal Tongue: None, normal,Extremity Movements Upper (arms, wrists, hands, fingers):  None, normal Lower (legs, knees, ankles, toes): None, normal, Trunk Movements Neck, shoulders, hips: None, normal, Overall Severity Severity of abnormal movements (highest score from questions above): None, normal Incapacitation due to abnormal movements: None, normal Patient's awareness  of abnormal movements (rate only patient's report): No Awareness, Dental Status Current problems with teeth and/or dentures?: No Does patient usually wear dentures?: No  CIWA:    COWS:     Musculoskeletal: Strength & Muscle Tone: within normal limits Gait & Station: normal Patient leans: N/A  Psychiatric Specialty Exam: Physical Exam  Nursing note and vitals reviewed. Constitutional: She is oriented to person, place, and time. She appears well-developed.  Cardiovascular: Normal rate.   Neurological: She is alert and oriented to person, place, and time.  Psychiatric: She has a normal mood and affect. Her behavior is normal.    Review of Systems  Musculoskeletal: Positive for back pain.  Psychiatric/Behavioral: Positive for depression. Negative for suicidal ideas. The patient is nervous/anxious.     Blood pressure 107/63, pulse 86, temperature 98.2 F (36.8 C), resp. rate 18, height '5\' 4"'$  (1.626 m), weight 74.4 kg (164 lb).Body mass index is 28.15 kg/m.  General Appearance: Casual  Eye Contact:  Good  Speech:  Clear and Coherent  Volume:  Normal  Mood:  Depressed  Affect:  Congruent  Thought Process:  Coherent  Orientation:  Full (Time, Place, and Person)  Thought Content:  Hallucinations: None  Suicidal Thoughts:  No  Homicidal Thoughts:  No  Memory:  Immediate;   Fair Recent;   Fair Remote;   Fair  Judgement:  Fair  Insight:  Fair  Psychomotor Activity:  Restlessness  Concentration:  Concentration: Fair  Recall:  [employer] of Knowledge:  Fair  Language:  Fair  Akathisia:  No  Handed:  Right  AIMS (if indicated):     Assets:  Communication Skills Desire for  Improvement Resilience Social Support  ADL's:  Intact  Cognition:  WNL  Sleep:  Number of Hours: 6    I agree with current treatment plan on 05/22/2016, Patient seen face-to-face for psychiatric evaluation follow-up, chart reviewed. Reviewed the information documented and agree with the treatment plan.   Treatment Plan Summary: Daily contact with patient to assess and evaluate symptoms and progress in treatment and Medication management   Started Flexeril 5 mg PRN and Toradol 60 mg IM for back pain. Rest and ICE PRN Zofran 4 mg for nausea/vomiting Continue with Celexa 10 mgs, Neurontin 800 mg TID, Wellbutrin 150 mg  for mood stabilization. Continue Ativan 0.'5mg'$  PRN for anxiety Continue with Trazodone 50 mg for insomnia Will continue to monitor vitals ,medication compliance and treatment side effects while patient is here.  Reviewed labs BMP-pending results,BAL - , UDS  CSW will start working on disposition.  Patient to participate in therapeutic milieu  Derrill Center, NP 05/22/2016, 10:04 AM

## 2016-05-22 NOTE — ED Notes (Signed)
Dr. Glick at bedside.  

## 2016-05-22 NOTE — ED Notes (Signed)
Pt ambulated to and from restroom without difficulty with this RN.

## 2016-05-22 NOTE — Progress Notes (Signed)
Patient c/o chest pain and pressure with right arm pain radiating up her neck, VSS but didn't give her NTG since her B/P is 94/67 at this time with heart rate I the 70-80's, will get an EKG and text the MD, while doing the EKG the patient started having short bursts of tremoring activity, neuro check done and was negative for any neuro changes from admission. Patient's skin remains warm, dry and intact, will get a blood sugar and continue to monitor.

## 2016-05-22 NOTE — BHH Group Notes (Signed)
Adult Therapy Group Note  Date: 05/22/2016  Time:  9:00-10:00AM  Group Topic/Focus: Healthy Press photographerupport Systems   Building Self Esteem:   The focus of this group was to assist patients in identifying their current healthy supports and unhealthy supports, then discussing how to add additional healthy supports and reduce existing unhealthy ones. The healthy additions included supports such as 12-step groups, individual therapy, psychiatrists, faith activities,  sponsors, group therapy, support groups, classes on mental health, and more.  A portion of group was devoted to talking about the helpfulness of learning one's diagnosis, educating oneself on that diagnosis, and sharing with others in one's life so that they also can come to understand the symptoms and perhaps be more accepting.  Participation Level:  Active  Participation Quality:  Attentive and Sharing  Affect:  Appropriate  Cognitive:  Appropriate  Insight: Good  Engagement in Group:  Engaged  Modes of Intervention:  Discussion and Support  Additional Comments:  The patient expressed that a current healthy support is her father, husband, sons, daughter-in-law, aunts and uncles while an unhealthy support is some family and friends who are judgmental, although her family is very protective of her during times of vulnerability like now  She was actively questioning to figure out what her diagnosis is, and was directed to speak with her doctor and let that individual, whether the doctor in the hospital or in outpatient practice, know how important it is for her to be made aware of her specific mental health diagnosis and what that means for her.  Lynnell ChadMareida J Grossman-Orr, LCSW 05/22/2016  1:25 PM

## 2016-05-22 NOTE — ED Triage Notes (Signed)
[facility]. Per EMS patient had chest pain 10/10 and following 1 sl NTG had decreased pain 7/10. EMS started IV left AC and administered aspirin 324mg  pta. Patient alert and oriented on arrival.

## 2016-05-22 NOTE — Progress Notes (Signed)
1355 notified by pt that she was nauseated, having  Chest pain radiating down her left arm rated " 10 out of 10" . Pt anxious and worried stating she has experienced similar pain x4 in the past and been treated in the ED for this and cardiologist told her she needed a cardiac cath for diagnosis. BP 112/69 HRR 75 o2 99. % RA. NP notified by this writer and 911 called and pt sent to [facility] after verbal report called by this Probation officer to [employer].

## 2016-05-22 NOTE — ED Notes (Signed)
Pt changed into wine colored scrubs at RN's request.

## 2016-05-22 NOTE — H&P (Deleted)
History and Physical    Betty PresserDianne R Perine ZDG:[id number]:[id number] DOB: 1971-01-24 DOA: 05/19/2016  PCP: Kirstie PeriSHAH,ASHISH, MD   [facility]  Chief Complaint: Chest pain   HPI: Betty Osborne is a 45 y.o. female with medical history significant for major depressive disorder, anxiety, and insomnia who presents from the behavioral health Hospital for evaluation of acute chest pain. Patient was admitted to the behavioral health Hospital on 05/20/2016 for major depression without psychosis. She reports waking in her usual state of health, but developing acute onset of left-sided chest pain with radiation to the left jaw, neck, and left arm at approximately 1:55 PM. Pain was described as 10/10 in severity, pressure-like in character, constant, starting at rest, and continuing unchanged until sublingual nitroglycerin was given by EMS and reduce the severity to a "7/10." There was mild nausea associated with the pain, but no diaphoresis or dyspnea. Patient has had similar pain previously that was reportedly attributed to anxiety. She was previously treated with metformin, but this was recently discontinued. She does not have known coronary artery disease, but reports a family history of premature CAD as well as history of hypertrophic cardiomyopathy in her father. Patient takes atenolol daily, but no antilipid or antiplatelet. EMS was activated and the patient was treated with 324 mg aspirin and one sublingual nitroglycerin en route to the hospital.  ED Course: Upon arrival to the ED, patient is found to be afebrile, saturating well on room air, and with vital signs stable. EKG features a normal sinus rhythm and appears to be a normal EKG. Chest x-ray is negative for acute cardiopulmonary disease and chemistry panel is unremarkable. CBC is notable for a mild normocytic anemia with hemoglobin of 11.7. Patient had ongoing pain in the emergency department, but with no change in her EKG. She  has remained hemodynamically stable and in no respiratory distress. She'll be observed in the stepdown unit for ongoing evaluation and management of chest pain with family history of premature CAD.   Review of Systems:  All other systems reviewed and apart from HPI, are negative.  Past Medical History:  Diagnosis Date  . Anxiety and depression   . Fibromyalgia   . IBS (irritable bowel syndrome)   . Overdose   . Tachycardia     Past Surgical History:  Procedure Laterality Date  . APPENDECTOMY    . CHOLECYSTECTOMY       reports that she has quit smoking. She has never used smokeless tobacco. She reports that she does not use drugs. Her alcohol history is not on file.  Allergies  Allergen Reactions  . Dilaudid [Hydromorphone Hcl]     Family History  Problem Relation Age of Onset  . Heart disease Father     had a heart transplant in 2008     Prior to Admission medications   Medication Sig Start Date End Date Taking? Authorizing Provider  atenolol (TENORMIN) 50 MG tablet Take 50 mg by mouth daily.    Historical Provider, MD  benzonatate (TESSALON) 100 MG capsule Take 100 mg by mouth 3 (three) times daily as needed for cough.    Historical Provider, MD  DULoxetine (CYMBALTA) 60 MG capsule Take 90 mg by mouth daily.  12/04/15   Historical Provider, MD  fluticasone (FLONASE) 50 MCG/ACT nasal spray Place 2 sprays into both nostrils daily.    Historical Provider, MD  gabapentin (NEURONTIN) 400 MG capsule Take 800 mg by mouth 3 (three) times daily.    Historical  Provider, MD  hydrOXYzine (VISTARIL) 25 MG capsule Take 25 mg by mouth 3 (three) times daily. 12/07/15   Historical Provider, MD  metFORMIN (GLUCOPHAGE) 1000 MG tablet Take 1,000 mg by mouth 2 (two) times daily with a meal.    Historical Provider, MD  polycarbophil (FIBERCON) 625 MG tablet Take 625 mg by mouth 2 (two) times daily.    Historical Provider, MD  tiZANidine (ZANAFLEX) 4 MG tablet Take 4 mg by mouth 4 (four) times  daily.    Historical Provider, MD    Physical Exam: Vitals:   05/22/16 1830 05/22/16 1845 05/22/16 1900 05/22/16 1915  BP: 101/62 100/70 103/73 98/61  Pulse: 69 73  69  Resp: 14 12 15 16   Temp:      TempSrc:      SpO2: 100% 100%  98%  Weight:      Height:          Constitutional: NAD, comfortable, anxious Eyes: PERTLA, lids and conjunctivae normal ENMT: Mucous membranes are moist. Posterior pharynx clear of any exudate or lesions.   Neck: normal, supple, no masses, no thyromegaly Respiratory: clear to auscultation bilaterally, no wheezing, no crackles. Normal respiratory effort.   Cardiovascular: S1 & S2 heard, regular rate and rhythm, no significant murmur. No extremity edema. 2+ pedal pulses. No significant JVD. Abdomen: No distension, no tenderness, no masses palpated. Bowel sounds normal.  Musculoskeletal: no clubbing / cyanosis. No joint deformity upper and lower extremities. Normal muscle tone.  Skin: no significant rashes, lesions, ulcers. Warm, dry, well-perfused. Neurologic: CN 2-12 grossly intact. Sensation intact, DTR normal. Strength 5/5 in all 4 limbs.  Psychiatric: Alert and oriented x 3. Very anxious. Denies hallucinations, SI, or HI.     Labs on Admission: I have personally reviewed following labs and imaging studies  CBC:  Recent Labs Lab 05/19/16 1328 05/22/16 1653  WBC 10.0 7.4  NEUTROABS 5.8  --   HGB 13.6 11.7*  HCT 40.7 35.6*  MCV 88.1 89.2  PLT 298 247   Basic Metabolic Panel:  Recent Labs Lab 05/19/16 1328 05/21/16 0617 05/22/16 1653  NA 132* 139 138  K 3.7 4.0 4.2  CL 96* 101 101  CO2 28 30 28   GLUCOSE 93 100* 94  BUN 13 16 11   CREATININE 0.80 0.85 0.90  CALCIUM 9.2 9.2 9.1   GFR: Estimated Creatinine Clearance: 77.1 mL/min (by C-G formula based on SCr of 0.9 mg/dL). Liver Function Tests:  Recent Labs Lab 05/22/16 1653  AST 50*  ALT 50  ALKPHOS 41  BILITOT 0.5  PROT 6.1*  ALBUMIN 3.7    Recent Labs Lab  05/22/16 1653  LIPASE 46   No results for input(s): AMMONIA in the last 168 hours. Coagulation Profile: No results for input(s): INR, PROTIME in the last 168 hours. Cardiac Enzymes: No results for input(s): CKTOTAL, CKMB, CKMBINDEX, TROPONINI in the last 168 hours. BNP (last 3 results) No results for input(s): PROBNP in the last 8760 hours. HbA1C: No results for input(s): HGBA1C in the last 72 hours. CBG: No results for input(s): GLUCAP in the last 168 hours. Lipid Profile: No results for input(s): CHOL, HDL, LDLCALC, TRIG, CHOLHDL, LDLDIRECT in the last 72 hours. Thyroid Function Tests:  Recent Labs  05/21/16 0617  TSH 1.416   Anemia Panel:  Recent Labs  05/21/16 0617  VITAMINB12 411  FOLATE 20.9   Urine analysis: No results found for: COLORURINE, APPEARANCEUR, LABSPEC, PHURINE, GLUCOSEU, HGBUR, BILIRUBINUR, KETONESUR, PROTEINUR, UROBILINOGEN, NITRITE, LEUKOCYTESUR Sepsis Labs: @LABRCNTIP (procalcitonin:4,lacticidven:4) )No  results found for this or any previous visit (from the past 240 hour(s)).   Radiological Exams on Admission: Dg Chest Portable 1 View  Result Date: 05/22/2016 CLINICAL DATA:  Chest pain EXAM: PORTABLE CHEST 1 VIEW COMPARISON:  April 24, 2016 FINDINGS: The heart size and mediastinal contours are within normal limits. Both lungs are clear. The visualized skeletal structures are unremarkable. IMPRESSION: No active disease. Electronically Signed   By: Gerome Sam III M.D   On: 05/22/2016 15:58    EKG: Independently reviewed. Normal sinus rhythm, normal EKG  Assessment/Plan  1. Chest pain  - Pt presents with acute chest pain, reports FHx of premature CAD - Initial EKG is without ischemic features and initial troponin is 0.01  - She was treated with ASA 324 mg by EMS pta  - Plan to monitor on telemetry for ischemic changes, obtain serial troponin measurements, and repeat EKG  - If patient rules-out for ACS, anxiety/panic could be considered  as possible etiology; no GI or pulmonary s/s elicited  2. Major depressive disorder  - Pt was admitted to [facility] for worsening depression  - She has hx of suicide attempt by overdose a few weeks ago, but denies any suicidal ideation on admission to [facility], and again here - Plan to continue her current medications, Cymbalta, trazodone, prn Ativan  3. Normocytic anemia  - Hgb is 11.7 on admission with normal MCV and no s/s of bleeding  - She is postmenopausal; outpatient follow-up advised     DVT prophylaxis: sq Lovenox Code Status: Full  Family Communication: Discussed with patient Disposition Plan: Observe on telemetry Consults called: None Admission status: Observation    Briscoe Deutscher, MD [facility] Pager [phone]  If 7PM-7AM, please contact night-coverage [email or web address] Password [facility]  05/22/2016, 7:41 PM

## 2016-05-22 NOTE — H&P (Signed)
History and Physical    Betty Osborne ZOX:[id number] DOB: December 15, 1970 DOA: 05/19/2016  PCP: Kirstie PeriSHAH,ASHISH, MD   [facility]  Chief Complaint: Chest pain   HPI: Betty Osborne is a 45 y.o. female with medical history significant for major depressive disorder, anxiety, and insomnia who presents from the behavioral health Hospital for evaluation of acute chest pain. Patient was admitted to the behavioral health Hospital on 05/20/2016 for major depression without psychosis. She reports waking in her usual state of health, but developing acute onset of left-sided chest pain with radiation to the left jaw, neck, and left arm at approximately 1:55 PM. Pain was described as 10/10 in severity, pressure-like in character, constant, starting at rest, and continuing unchanged until sublingual nitroglycerin was given by EMS and reduce the severity to a "7/10." There was mild nausea associated with the pain, but no diaphoresis or dyspnea. Patient has had similar pain previously that was reportedly attributed to anxiety. She was previously treated with metformin, but this was recently discontinued. She does not have known coronary artery disease, but reports a family history of premature CAD as well as history of hypertrophic cardiomyopathy in her father. Patient takes atenolol daily, but no antilipid or antiplatelet. EMS was activated and the patient was treated with 324 mg aspirin and one sublingual nitroglycerin en route to the hospital.  ED Course: Upon arrival to the ED, patient is found to be afebrile, saturating well on room air, and with vital signs stable. EKG features a normal sinus rhythm and appears to be a normal EKG. Chest x-ray is negative for acute cardiopulmonary disease and chemistry panel is unremarkable. CBC is notable for a mild normocytic anemia with hemoglobin of 11.7. Patient had ongoing pain in the emergency department, but with no change in her EKG.  She has remained hemodynamically stable and in no respiratory distress. She'll be observed in the stepdown unit for ongoing evaluation and management of chest pain with family history of premature CAD.   Review of Systems:  All other systems reviewed and apart from HPI, are negative.      Past Medical History:  Diagnosis Date  . Anxiety and depression   . Fibromyalgia   . IBS (irritable bowel syndrome)   . Overdose   . Tachycardia          Past Surgical History:  Procedure Laterality Date  . APPENDECTOMY    . CHOLECYSTECTOMY       reports that she has quit smoking. She has never used smokeless tobacco. She reports that she does not use drugs. Her alcohol history is not on file.      Allergies  Allergen Reactions  . Dilaudid [Hydromorphone Hcl]           Family History  Problem Relation Age of Onset  . Heart disease Father     had a heart transplant in 2008            Prior to Admission medications   Medication Sig Start Date End Date Taking? Authorizing Provider  atenolol (TENORMIN) 50 MG tablet Take 50 mg by mouth daily.    Historical Provider, MD  benzonatate (TESSALON) 100 MG capsule Take 100 mg by mouth 3 (three) times daily as needed for cough.    Historical Provider, MD  DULoxetine (CYMBALTA) 60 MG capsule Take 90 mg by mouth daily.  12/04/15   Historical Provider, MD  fluticasone (FLONASE) 50 MCG/ACT nasal spray Place 2 sprays into both nostrils  daily.    Historical Provider, MD  gabapentin (NEURONTIN) 400 MG capsule Take 800 mg by mouth 3 (three) times daily.    Historical Provider, MD  hydrOXYzine (VISTARIL) 25 MG capsule Take 25 mg by mouth 3 (three) times daily. 12/07/15   Historical Provider, MD  metFORMIN (GLUCOPHAGE) 1000 MG tablet Take 1,000 mg by mouth 2 (two) times daily with a meal.    Historical Provider, MD  polycarbophil (FIBERCON) 625 MG tablet Take 625 mg by mouth 2 (two) times daily.    Historical  Provider, MD  tiZANidine (ZANAFLEX) 4 MG tablet Take 4 mg by mouth 4 (four) times daily.    Historical Provider, MD    Physical Exam:       Vitals:   05/22/16 1830 05/22/16 1845 05/22/16 1900 05/22/16 1915  BP: 101/62 100/70 103/73 98/61  Pulse: 69 73  69  Resp: 14 12 15 16   Temp:      TempSrc:      SpO2: 100% 100%  98%  Weight:      Height:          Constitutional: NAD, comfortable, anxious Eyes: PERTLA, lids and conjunctivae normal ENMT: Mucous membranes are moist. Posterior pharynx clear of any exudate or lesions.   Neck: normal, supple, no masses, no thyromegaly Respiratory: clear to auscultation bilaterally, no wheezing, no crackles. Normal respiratory effort.   Cardiovascular: S1 & S2 heard, regular rate and rhythm, no significant murmur. No extremity edema. 2+ pedal pulses. No significant JVD. Abdomen: No distension, no tenderness, no masses palpated. Bowel sounds normal.  Musculoskeletal: no clubbing / cyanosis. No joint deformity upper and lower extremities. Normal muscle tone.  Skin: no significant rashes, lesions, ulcers. Warm, dry, well-perfused. Neurologic: CN 2-12 grossly intact. Sensation intact, DTR normal. Strength 5/5 in all 4 limbs.  Psychiatric: Alert and oriented x 3. Very anxious. Denies hallucinations, SI, or HI.     Labs on Admission: I have personally reviewed following labs and imaging studies  CBC:  Last Labs    Recent Labs Lab 05/19/16 1328 05/22/16 1653  WBC 10.0 7.4  NEUTROABS 5.8  --   HGB 13.6 11.7*  HCT 40.7 35.6*  MCV 88.1 89.2  PLT 298 247     Basic Metabolic Panel:  Last Labs    Recent Labs Lab 05/19/16 1328 05/21/16 0617 05/22/16 1653  NA 132* 139 138  K 3.7 4.0 4.2  CL 96* 101 101  CO2 28 30 28   GLUCOSE 93 100* 94  BUN 13 16 11   CREATININE 0.80 0.85 0.90  CALCIUM 9.2 9.2 9.1     GFR: Estimated Creatinine Clearance: 77.1 mL/min (by C-G formula based on SCr of 0.9  mg/dL). Liver Function Tests:  Last Labs    Recent Labs Lab 05/22/16 1653  AST 50*  ALT 50  ALKPHOS 41  BILITOT 0.5  PROT 6.1*  ALBUMIN 3.7      Last Labs    Recent Labs Lab 05/22/16 1653  LIPASE 46     Last Labs   No results for input(s): AMMONIA in the last 168 hours.   Coagulation Profile: Last Labs   No results for input(s): INR, PROTIME in the last 168 hours.   Cardiac Enzymes: Last Labs   No results for input(s): CKTOTAL, CKMB, CKMBINDEX, TROPONINI in the last 168 hours.   BNP (last 3 results) Recent Labs (within last 365 days)  No results for input(s): PROBNP in the last 8760 hours.   HbA1C: Recent Labs (last  2 labs)   No results for input(s): HGBA1C in the last 72 hours.   CBG: Last Labs   No results for input(s): GLUCAP in the last 168 hours.   Lipid Profile: Recent Labs (last 2 labs)   No results for input(s): CHOL, HDL, LDLCALC, TRIG, CHOLHDL, LDLDIRECT in the last 72 hours.   Thyroid Function Tests:  Recent Labs (last 2 labs)    Recent Labs  05/21/16 0617  TSH 1.416     Anemia Panel:  Recent Labs (last 2 labs)    Recent Labs  05/21/16 0617  VITAMINB12 411  FOLATE 20.9     Urine analysis: Labs (Brief)  No results found for: COLORURINE, APPEARANCEUR, LABSPEC, PHURINE, GLUCOSEU, HGBUR, BILIRUBINUR, KETONESUR, PROTEINUR, UROBILINOGEN, NITRITE, LEUKOCYTESUR   Sepsis Labs: @LABRCNTIP (procalcitonin:4,lacticidven:4) )No results found for this or any previous visit (from the past 240 hour(s)).   Radiological Exams on Admission:  Imaging Results (Last 48 hours)  Dg Chest Portable 1 View  Result Date: 05/22/2016 CLINICAL DATA:  Chest pain EXAM: PORTABLE CHEST 1 VIEW COMPARISON:  April 24, 2016 FINDINGS: The heart size and mediastinal contours are within normal limits. Both lungs are clear. The visualized skeletal structures are unremarkable. IMPRESSION: No active disease. Electronically Signed   By: Gerome Samavid  Williams  III M.D   On: 05/22/2016 15:58     EKG: Independently reviewed. Normal sinus rhythm, normal EKG  Assessment/Plan  1. Chest pain  - Pt presents with acute chest pain, reports FHx of premature CAD - Initial EKG is without ischemic features and initial troponin is 0.01  - She was treated with ASA 324 mg by EMS pta  - Plan to monitor on telemetry for ischemic changes, obtain serial troponin measurements, and repeat EKG  - If patient rules-out for ACS, anxiety/panic could be considered as possible etiology; no GI or pulmonary s/s elicited  2. Major depressive disorder  - [facility]  - She has hx of suicide attempt by overdose a few weeks ago, but denies any suicidal ideation on [facility], and again here - Plan to continue her current medications, Cymbalta, trazodone, prn Ativan  3. Normocytic anemia  - Hgb is 11.7 on admission with normal MCV and no s/s of bleeding  - She is postmenopausal; outpatient follow-up advised     DVT prophylaxis: sq Lovenox Code Status: Full  Family Communication: Discussed with patient Disposition Plan: Observe on telemetry Consults called: None Admission status: Observation    Briscoe Deutscherimothy S Opyd, MD [facility] Pager [phone]  If 7PM-7AM, please contact night-coverage [email or web address] Password [email or web address]

## 2016-05-23 ENCOUNTER — Encounter (HOSPITAL_COMMUNITY): Payer: Self-pay | Admitting: Cardiology

## 2016-05-23 ENCOUNTER — Observation Stay (HOSPITAL_COMMUNITY): Payer: Medicaid Other

## 2016-05-23 ENCOUNTER — Encounter: Payer: Self-pay | Admitting: Cardiology

## 2016-05-23 ENCOUNTER — Telehealth (HOSPITAL_COMMUNITY): Payer: Self-pay | Admitting: *Deleted

## 2016-05-23 DIAGNOSIS — M797 Fibromyalgia: Secondary | ICD-10-CM | POA: Diagnosis present

## 2016-05-23 DIAGNOSIS — Z8249 Family history of ischemic heart disease and other diseases of the circulatory system: Secondary | ICD-10-CM

## 2016-05-23 DIAGNOSIS — D649 Anemia, unspecified: Secondary | ICD-10-CM | POA: Diagnosis present

## 2016-05-23 DIAGNOSIS — R079 Chest pain, unspecified: Secondary | ICD-10-CM

## 2016-05-23 DIAGNOSIS — R9439 Abnormal result of other cardiovascular function study: Secondary | ICD-10-CM | POA: Diagnosis present

## 2016-05-23 DIAGNOSIS — Z79899 Other long term (current) drug therapy: Secondary | ICD-10-CM

## 2016-05-23 DIAGNOSIS — Z915 Personal history of self-harm: Secondary | ICD-10-CM | POA: Diagnosis not present

## 2016-05-23 DIAGNOSIS — Z87891 Personal history of nicotine dependence: Secondary | ICD-10-CM | POA: Diagnosis not present

## 2016-05-23 DIAGNOSIS — Z9889 Other specified postprocedural states: Secondary | ICD-10-CM | POA: Diagnosis not present

## 2016-05-23 DIAGNOSIS — G4700 Insomnia, unspecified: Secondary | ICD-10-CM | POA: Diagnosis present

## 2016-05-23 DIAGNOSIS — K589 Irritable bowel syndrome without diarrhea: Secondary | ICD-10-CM | POA: Diagnosis not present

## 2016-05-23 DIAGNOSIS — F419 Anxiety disorder, unspecified: Secondary | ICD-10-CM | POA: Diagnosis present

## 2016-05-23 DIAGNOSIS — F332 Major depressive disorder, recurrent severe without psychotic features: Secondary | ICD-10-CM | POA: Diagnosis present

## 2016-05-23 DIAGNOSIS — Z9049 Acquired absence of other specified parts of digestive tract: Secondary | ICD-10-CM

## 2016-05-23 DIAGNOSIS — R0789 Other chest pain: Secondary | ICD-10-CM | POA: Diagnosis not present

## 2016-05-23 DIAGNOSIS — R072 Precordial pain: Secondary | ICD-10-CM | POA: Diagnosis present

## 2016-05-23 LAB — BASIC METABOLIC PANEL
ANION GAP: 5 (ref 5–15)
BUN: 11 mg/dL (ref 6–20)
CALCIUM: 8.7 mg/dL — AB (ref 8.9–10.3)
CO2: 31 mmol/L (ref 22–32)
Chloride: 103 mmol/L (ref 101–111)
Creatinine, Ser: 0.83 mg/dL (ref 0.44–1.00)
GFR calc Af Amer: >60 mL/min (ref >60)
GLUCOSE: 95 mg/dL (ref 65–99)
POTASSIUM: 4 mmol/L (ref 3.5–5.1)
Sodium: 139 mmol/L (ref 135–145)

## 2016-05-23 LAB — LIPID PANEL
CHOLESTEROL: 137 mg/dL (ref 0–200)
HDL: 51 mg/dL (ref >40)
LDL Cholesterol: 73 mg/dL (ref 0–99)
Total CHOL/HDL Ratio: 2.7 RATIO
Triglycerides: 65 mg/dL (ref <150)
VLDL: 13 mg/dL (ref 0–40)

## 2016-05-23 LAB — EXERCISE TOLERANCE TEST
CHL CUP RESTING HR STRESS: 83 {beats}/min
CSEPED: 5 min
CSEPHR: 79 %
Estimated workload: 7 METS
Exercise duration (sec): 39 s
Peak HR: 139 {beats}/min

## 2016-05-23 LAB — TROPONIN I

## 2016-05-23 LAB — GLUCOSE, CAPILLARY
Glucose-Capillary: 84 mg/dL (ref 65–99)
Glucose-Capillary: 94 mg/dL (ref 65–99)

## 2016-05-23 LAB — MRSA PCR SCREENING: MRSA by PCR: NEGATIVE

## 2016-05-23 LAB — VITAMIN D 25 HYDROXY (VIT D DEFICIENCY, FRACTURES): Vit D, 25-Hydroxy: 34.3 ng/mL (ref 30.0–100.0)

## 2016-05-23 MED ORDER — BUPROPION HCL ER (XL) 150 MG PO TB24
150.0000 mg | ORAL_TABLET | Freq: Every day | ORAL | Status: DC
  Administered 2016-05-23 – 2016-05-24 (×2): 150 mg via ORAL
  Filled 2016-05-23 (×2): qty 1

## 2016-05-23 MED ORDER — IBUPROFEN 600 MG PO TABS
600.0000 mg | ORAL_TABLET | Freq: Once | ORAL | Status: AC
  Administered 2016-05-23: 600 mg via ORAL
  Filled 2016-05-23: qty 1

## 2016-05-23 MED ORDER — SODIUM CHLORIDE 0.9 % IV SOLN
INTRAVENOUS | Status: DC
  Administered 2016-05-23: 11:00:00 via INTRAVENOUS

## 2016-05-23 MED ORDER — SODIUM CHLORIDE 0.9 % WEIGHT BASED INFUSION
3.0000 mL/kg/h | INTRAVENOUS | Status: DC
  Administered 2016-05-24 (×2): 3 mL/kg/h via INTRAVENOUS

## 2016-05-23 MED ORDER — CITALOPRAM HYDROBROMIDE 10 MG PO TABS
10.0000 mg | ORAL_TABLET | Freq: Every day | ORAL | Status: DC
  Administered 2016-05-23 – 2016-05-24 (×2): 10 mg via ORAL
  Filled 2016-05-23 (×2): qty 1

## 2016-05-23 MED ORDER — SODIUM CHLORIDE 0.9 % WEIGHT BASED INFUSION: 1.0000 mL/kg/h | INTRAVENOUS | Status: DC

## 2016-05-23 MED ORDER — SODIUM CHLORIDE 0.9 % IV BOLUS (SEPSIS)
250.0000 mL | Freq: Once | INTRAVENOUS | Status: AC
Start: 2016-05-23 — End: 2016-05-23
  Administered 2016-05-23: 250 mL via INTRAVENOUS

## 2016-05-23 MED ORDER — LORAZEPAM 0.5 MG PO TABS
0.5000 mg | ORAL_TABLET | Freq: Two times a day (BID) | ORAL | Status: DC | PRN
  Administered 2016-05-24: 0.5 mg via ORAL
  Filled 2016-05-23: qty 1

## 2016-05-23 NOTE — Clinical Social Work Psych Assess (Signed)
Clinical Social Work Librarian, academicsych Service Line Assessment  Clinical Social Worker:  Haze JustinCampbell, Maury Groninger B, LCSW Date/Time:  05/23/2016, 2:14 PM Referred By:  Physician Date Referred:  05/23/16 Reason for Referral:  Behavioral Health Issues   Presenting Symptoms/Problems  Presenting Symptoms/Problems(in person's/family's own words):  The patient shares that she has been experiencing increased anxiety and depression since being discharged from [facility] in [place].   Abuse/Neglect/Trauma History  Abuse/Neglect/Trauma History:  Denies History Abuse/Neglect/Trauma History Comments (indicate dates):  Denies   Psychiatric History  Psychiatric History:  Inpatient/Hospitalization, Outpatient Treatment Psychiatric Medication:  The patient states that her cymbalta was changed to Celexa and Wellbutrin.    Current Mental Health Hospitalizations/Previous Mental Health History:  The patient was participating in outpatient therapy but reported that she needed assistance with getting her medications "worked out" which is why [facility].   Current Provider:  She reports that she does not have a psychiatrist at this time. She would like to [facility] on an outpatient basis.  Place and Date:    Current Medications:  See med list.   Previous Inpatient Admission/Date/Reason:  The patient [facility] voluntarily as she was not able to get her medication issues addressed on an outpatient basis. She states she kept running into barriers because she did not have all the paperwork that was needed.   Emotional Health/Current Symptoms  Suicide/Self Harm: Suicide Attempt in the Past (date/description) Suicide Attempt in Past (date/description):  Patient states she attempted suicide in early November. She denies any suicidal ideation, plans, means or intent.   Other Harmful Behavior (ex. homicidal ideation) (describe):  None  reported.   Psychotic/Dissociative Symptoms  Psychotic/Dissociative Symptoms: None Reported Other Psychotic/Dissociative Symptoms:  None   Attention/Behavioral Symptoms  Attention/Behavioral Symptoms: Within Normal Limits Other Attention/Behavioral Symptoms:  NONE   Cognitive Impairment  Cognitive Impairment:  Within Normal Limits Other Cognitive Impairment:  NONE   Mood and Adjustment  Mood and Adjustment:  Anxious, Depression (Self report, but the patient appears calm and pleasant in CSW's presence.)   Stress, Anxiety, Trauma, Any Recent Loss/Stressor  Stress, Anxiety, Trauma, Any Recent Loss/Stressor: Anxiety Anxiety (frequency):  Daily  Phobia (specify):  NONE  Compulsive Behavior (specify):  NONE  Obsessive Behavior (specify):  NONE  Other Stress, Anxiety, Trauma, Any Recent Loss/Stressor:  NONE   Substance Abuse/Use  Substance Abuse/Use: None SBIRT Completed (please refer for detailed history): No Self-reported Substance Use (last use and frequency):  NONE  Urinary Drug Screen Completed: Yes Alcohol Level:  NONE   Environment/Housing/Living Arrangement  Environmental/Housing/Living Arrangement: Stable Housing Who is in the Home:  Supportive husband and 45 year old son.  Emergency Contact:  Raquel JamesDwayne Raspberry [phone]   Financial  Financial: Medicaid   Patient's Strengths and Goals  Patient's Strengths and Goals (patient's own words):  The patient states that she wants to live. She loves her husband and her 45 year old son. She states she wants to be here for them. The patient is very involved in his son's life until recently which made her aware of her need for help. The patient states that the following are her goals:  1. She wants to get her medications worked out. 2. She wants to keep set up with a psychiatrist on at outpatient basis and outpatient counseling, [facility]. 3. The patient is interested in support  group offerings.   Clinical Social Worker's Interpretive Summary  Clinical Social  Workers Interpretive Summary:  The patient appears well at this time. She adamantly denies any suicidal ideation, plans, means, or intent but is tolerating presence of the sitter very well. The patient states that [facility] to SI and actually went on her own accord to seek assistance with having her medications adjusted as she wasn't feeling herself. The patient states that she has good home support and feels that she just needs to be connected with an outpatient counseling and psychiatrist. CSW will follow and wait for psychiatrist recommendations.    Disposition  Disposition: Recommend Psych CSW Continuing To Support While In [facility] (CSW will wait for recommendations from psychiatrist.)   Roddie McBryant Kristoff Coonradt MSW, [place]CS[place], [place], [phone]

## 2016-05-23 NOTE — Consult Note (Signed)
[facility] Consult   Reason for Consult:  MDD Referring Physician:  [place] Patient Identification: Betty Osborne MRN:  [id number] Principal Diagnosis: MDD (major depressive disorder), recurrent severe, without psychosis ([place]) Diagnosis:   Patient Active Problem List   Diagnosis Date Noted  . MDD (major depressive disorder), recurrent severe, without psychosis ([place]) [F33.2] 05/19/2016    Priority: High  . Family history of coronary artery disease in father [Z82.49] 05/23/2016  . Chest pain [R07.9] 05/22/2016  . Normocytic anemia [D64.9] 05/22/2016  . Severe episode of recurrent major depressive disorder, without psychotic features ([place]) [F33.2]     Total Time spent with patient: 20 minutes  Subjective:   Betty Osborne is a 45 y.o. female transfer to [facility] from [facility], treated for depression/anxiety/insomnia. Pt seen and chart reviewed. Pt is alert/oriented x4, calm, cooperative, and appropriate to situation. Pt denies suicidal/homicidal ideation and psychosis and does not appear to be responding to internal stimuli. Pt reports that she is feeling much better, sleeping well, and feeling as though her moods are very stable on the medication regimen prescribed by Dr. Parke Poisson at [facility]. Pt would like to continue this regimen and followup outpatient with psychiatry (her preference is [facility] and SW is already involved).  HPI: Ms. Gryder is a 45yo female patient admitted with reports of major depression and anxiety sent to [facility] from [place], then from [facility] to [facility] hospital floor for chest pain, having been there for 4 days, continuing psych meds. Pt has a longstanding history of anxiety and depression with situational triggers but was doing well on outpatient meds until running out of several of her prescriptions resulting in decompensation and subsequent hospital admission.   Past Psychiatric History: MDD, GAD  Risk to Self: Is patient at risk for suicide?: No Risk to Others:   Prior Inpatient  Therapy:   Prior Outpatient Therapy:    Past Medical History:  Past Medical History:  Diagnosis Date  . Anxiety and depression   . Fibromyalgia   . IBS (irritable bowel syndrome)   . Overdose   . Tachycardia     Past Surgical History:  Procedure Laterality Date  . APPENDECTOMY    . CHOLECYSTECTOMY     Family History:  Family History  Problem Relation Age of Onset  . Heart disease Father     had a heart transplant in 2008   Family Psychiatric  History: denies Social History:  History  Alcohol use Not on file    Comment: former     History  Drug Use No    Social History   Social History  . Marital status: Married    Spouse name: N/A  . Number of children: N/A  . Years of education: N/A   Social History Main Topics  . Smoking status: Former Research scientist (life sciences)  . Smokeless tobacco: Never Used  . Alcohol use None     Comment: former  . Drug use: No  . Sexual activity: Not Asked   Other Topics Concern  . None   Social History Narrative  . None   Additional Social History:    Allergies:   Allergies  Allergen Reactions  . Dilaudid [Hydromorphone Hcl]     Labs:  Results for orders placed or performed during the hospital encounter of 05/22/16 (from the past 48 hour(s))  MRSA PCR Screening     Status: None   Collection Time: 05/22/16  8:31 PM  Result Value Ref Range   MRSA by PCR NEGATIVE NEGATIVE  Comment:        The GeneXpert MRSA Assay (FDA approved for NASAL specimens only), is one component of a comprehensive MRSA colonization surveillance program. It is not intended to diagnose MRSA infection nor to guide or monitor treatment for MRSA infections.   Troponin I     Status: None   Collection Time: 05/22/16  9:01 PM  Result Value Ref Range   Troponin I <0.03 <0.03 ng/mL  Glucose, capillary     Status: None   Collection Time: 05/22/16 11:43 PM  Result Value Ref Range   Glucose-Capillary 84 65 - 99 mg/dL  Troponin I     Status: None   Collection  Time: 05/23/16  2:55 AM  Result Value Ref Range   Troponin I <0.03 <0.03 ng/mL  Lipid panel     Status: None   Collection Time: 05/23/16  2:55 AM  Result Value Ref Range   Cholesterol 137 0 - 200 mg/dL   Triglycerides 65 <150 mg/dL   HDL 51 >40 mg/dL   Total CHOL/HDL Ratio 2.7 RATIO   VLDL 13 0 - 40 mg/dL   LDL Cholesterol 73 0 - 99 mg/dL    Comment:        Total Cholesterol/HDL:CHD Risk Coronary Heart Disease Risk Table                     Men   Women  1/2 Average Risk   3.4   3.3  Average Risk       5.0   4.4  2 X Average Risk   9.6   7.1  3 X Average Risk  23.4   11.0        Use the calculated Patient Ratio above and the CHD Risk Table to determine the patient's CHD Risk.        ATP III CLASSIFICATION (LDL):  <100     mg/dL   Optimal  100-129  mg/dL   Near or Above                    Optimal  130-159  mg/dL   Borderline  160-189  mg/dL   High  >190     mg/dL   Very High   Glucose, capillary     Status: None   Collection Time: 05/23/16  7:49 AM  Result Value Ref Range   Glucose-Capillary 94 65 - 99 mg/dL   Comment 1 Document in Chart   Troponin I     Status: None   Collection Time: 05/23/16 10:30 AM  Result Value Ref Range   Troponin I <0.03 <0.03 ng/mL  Basic metabolic panel     Status: Abnormal   Collection Time: 05/23/16 10:30 AM  Result Value Ref Range   Sodium 139 135 - 145 mmol/L   Potassium 4.0 3.5 - 5.1 mmol/L   Chloride 103 101 - 111 mmol/L   CO2 31 22 - 32 mmol/L   Glucose, Bld 95 65 - 99 mg/dL   BUN 11 6 - 20 mg/dL   Creatinine, Ser 0.83 0.44 - 1.00 mg/dL   Calcium 8.7 (L) 8.9 - 10.3 mg/dL   GFR calc non Af Amer >60 >60 mL/min   GFR calc Af Amer >60 >60 mL/min    Comment: (NOTE) The eGFR has been calculated using the CKD EPI equation. This calculation has not been validated in all clinical situations. eGFR's persistently <60 mL/min signify possible Chronic Kidney Disease.    Anion gap  5 5 - 15    Current Facility-Administered Medications   Medication Dose Route Frequency Provider Last Rate Last Dose  . 0.9 %  sodium chloride infusion   Intravenous Continuous Geradine Girt, DO 75 mL/hr at 05/23/16 1058    . acetaminophen (TYLENOL) tablet 650 mg  650 mg Oral Q4H PRN Vianne Bulls, MD      . ALPRAZolam Duanne Moron) tablet 0.5 mg  0.5 mg Oral TID PRN Vianne Bulls, MD      . alum & mag hydroxide-simeth (MAALOX/MYLANTA) 200-200-20 MG/5ML suspension 30 mL  30 mL Oral PRN Vianne Bulls, MD   30 mL at 05/22/16 2156  . aspirin EC tablet 81 mg  81 mg Oral Daily Vianne Bulls, MD   81 mg at 05/23/16 0827  . benzonatate (TESSALON) capsule 100 mg  100 mg Oral TID PRN Vianne Bulls, MD      . buPROPion (WELLBUTRIN XL) 24 hr tablet 150 mg  150 mg Oral Daily Geradine Girt, DO   150 mg at 05/23/16 1058  . citalopram (CELEXA) tablet 10 mg  10 mg Oral Daily Geradine Girt, DO   10 mg at 05/23/16 1058  . enoxaparin (LOVENOX) injection 40 mg  40 mg Subcutaneous Q24H Vianne Bulls, MD   40 mg at 05/22/16 2155  . gabapentin (NEURONTIN) capsule 800 mg  800 mg Oral TID Vianne Bulls, MD   800 mg at 05/23/16 6803  . nitroGLYCERIN (NITROSTAT) SL tablet 0.4 mg  0.4 mg Sublingual Q5 Min x 3 PRN Vianne Bulls, MD      . ondansetron (ZOFRAN) injection 4 mg  4 mg Intravenous Q6H PRN Vianne Bulls, MD      . pneumococcal 23 valent vaccine (PNU-IMMUNE) injection 0.5 mL  0.5 mL Intramuscular Prior to discharge Vianne Bulls, MD      . traMADol Veatrice Bourbon) tablet 50 mg  50 mg Oral Q6H PRN Vianne Bulls, MD   50 mg at 05/22/16 2156  . traZODone (DESYREL) tablet 50 mg  50 mg Oral QHS Vianne Bulls, MD   50 mg at 05/22/16 2307    Musculoskeletal: Strength & Muscle Tone: within normal limits Gait & Station: normal Patient leans: N/A  Psychiatric Specialty Exam: Physical Exam  Review of Systems  Psychiatric/Behavioral: Positive for depression. Negative for hallucinations and suicidal ideas. The patient is nervous/anxious and has insomnia.   All other  systems reviewed and are negative.   Blood pressure 98/75, pulse 76, temperature 98.1 F (36.7 C), temperature source Oral, resp. rate 20, height _0  (1.626 m), weight 74.3 kg (163 lb 14.4 oz), SpO2 99 %.Body mass index is 28.13 kg/m.  General Appearance: Casual and Fairly Groomed  Eye Contact:  Good  Speech:  Clear and Coherent and Normal Rate  Volume:  Normal  Mood:  Euthymic  Affect:  Appropriate and Congruent  Thought Process:  Coherent, Goal Directed, Linear and Descriptions of Associations: Intact  Orientation:  Full (Time, Place, and Person)  Thought Content:  Focused on outpatient therapy and finding a psychiatrist   Suicidal Thoughts:  No  Homicidal Thoughts:  No  Memory:  Immediate;   Fair Recent;   Fair Remote;   Fair  Judgement:  Fair  Insight:  Fair  Psychomotor Activity:  Normal  Concentration:  Concentration: Fair and Attention Span: Fair  Recall:  [employer] of Knowledge:  Fair  Language:  Fair  Akathisia:  No  Handed:    AIMS (if indicated):     Assets:  Communication Skills Desire for Improvement Financial Resources/Insurance Housing Resilience Social Support  ADL's:  Intact  Cognition:  WNL  Sleep:      Treatment Plan Summary: MDD (major depressive disorder), recurrent severe, without psychosis ([place]) stable for outpatient management, treated as below:  Medications:  -continue Celexa 58m po daily for MDD -continue trazodone 586mpo qhs prn insomnia -continue Wellbutrin 15079mor affective symptoms -continue gabapentin 800m72m tid for mood stabilization -discontinue Xanax 0.5mg 42m -replace Xanax with Ativan 0.5mg p76mid prn anxiety (outpatient can taper her)   Disposition: No evidence of imminent risk to self or others at present.   Patient does not meet criteria for psychiatric inpatient admission. Supportive therapy provided about ongoing stressors. Refer to IOP. Discussed crisis plan, support from social network, calling 911, coming to  the Emergency Department, and calling Suicide Hotline. Have SW find her a local psychiatrist and please give... 30 day prescriptions for all psych meds as we would do this if she were at [facility]  [facility]/2017 12:16 PM   Agree with NP assessment as above

## 2016-05-23 NOTE — Consult Note (Signed)
Reason for Consult: chest pain  Referring Physician: Dr. Benjamine MolaVann  PCP:  Kirstie PeriSHAH,ASHISH, MD  Primary Cardiologist:new- had appt with Dr. Antoine PocheHochrein in past - it was cancelled.  Betty Osborne is an 45 y.o. female.    [facility]   HPI:   Asked to see [age] yof with no prior cardiac hx though in July 2017 pt had appt with Dr. Antoine PocheHochrein but she did not make the appointment.     [name].  She has major depressive disorder, anxiety and insomnia - She had developed acute onset of Lt sided chest pain with radiation to Lt jaw, neck and lt arm.  NTG reduced pain from 10 to 7.  + mild nausea, no diaphoresis or dyspnea.   Hx of premature CAD in father.    Currently.  EKG SR no acute changes, stable.  troponins are neg.  LDL 73 TSH 1.416.   Past Medical History:  Diagnosis Date  . Anxiety and depression   . Fibromyalgia   . IBS (irritable bowel syndrome)   . Overdose   . Tachycardia     Past Surgical History:  Procedure Laterality Date  . APPENDECTOMY    . CHOLECYSTECTOMY      Family History  Problem Relation Age of Onset  . Heart disease Father     had a heart transplant in 2008   Social History:  reports that she has quit smoking. She has never used smokeless tobacco. She reports that she does not use drugs. Her alcohol history is not on file.  Allergies:  Allergies  Allergen Reactions  . Dilaudid [Hydromorphone Hcl]     OUTPATIENT MEDICATIONS: No current facility-administered medications on file prior to encounter.    Current Outpatient Prescriptions on File Prior to Encounter  Medication Sig Dispense Refill  . atenolol (TENORMIN) 50 MG tablet Take 50 mg by mouth daily.    . benzonatate (TESSALON) 100 MG capsule Take 100 mg by mouth 3 (three) times daily as needed for cough.    . DULoxetine (CYMBALTA) 60 MG capsule Take 90 mg by mouth daily.   1  . fluticasone (FLONASE) 50  MCG/ACT nasal spray Place 2 sprays into both nostrils daily.    Marland Kitchen. gabapentin (NEURONTIN) 400 MG capsule Take 800 mg by mouth 3 (three) times daily.    . hydrOXYzine (VISTARIL) 25 MG capsule Take 25 mg by mouth 3 (three) times daily.  0  . metFORMIN (GLUCOPHAGE) 1000 MG tablet Take 1,000 mg by mouth 2 (two) times daily with a meal.    . polycarbophil (FIBERCON) 625 MG tablet Take 625 mg by mouth 2 (two) times daily.    Marland Kitchen. tiZANidine (ZANAFLEX) 4 MG tablet Take 4 mg by mouth 4 (four) times daily.      CURRENT MEDICATIONS: Scheduled Meds: . aspirin EC  81 mg Oral Daily  . atenolol  50 mg Oral Daily  . DULoxetine  90 mg Oral Daily  . enoxaparin (LOVENOX) injection  40 mg Subcutaneous Q24H  . gabapentin  800 mg Oral TID  . hydrOXYzine  25 mg Oral TID  . traZODone  50 mg Oral QHS   Continuous Infusions: PRN Meds:.acetaminophen, ALPRAZolam, alum & mag hydroxide-simeth, benzonatate, nitroGLYCERIN, ondansetron (ZOFRAN) IV, pneumococcal 23 valent vaccine, traMADol   Results for orders placed or performed during the hospital encounter of 05/22/16 (from the past 48 hour(s))  MRSA PCR  Screening     Status: None   Collection Time: 05/22/16  8:31 PM  Result Value Ref Range   MRSA by PCR NEGATIVE NEGATIVE    Comment:        The GeneXpert MRSA Assay (FDA approved for NASAL specimens only), is one component of a comprehensive MRSA colonization surveillance program. It is not intended to diagnose MRSA infection nor to guide or monitor treatment for MRSA infections.   Troponin I     Status: None   Collection Time: 05/22/16  9:01 PM  Result Value Ref Range   Troponin I <0.03 <0.03 ng/mL  Troponin I     Status: None   Collection Time: 05/23/16  2:55 AM  Result Value Ref Range   Troponin I <0.03 <0.03 ng/mL  Lipid panel     Status: None   Collection Time: 05/23/16  2:55 AM  Result Value Ref Range   Cholesterol 137 0 - 200 mg/dL   Triglycerides 65 <308 mg/dL   HDL 51 >65 mg/dL   Total  CHOL/HDL Ratio 2.7 RATIO   VLDL 13 0 - 40 mg/dL   LDL Cholesterol 73 0 - 99 mg/dL    Comment:        Total Cholesterol/HDL:CHD Risk Coronary Heart Disease Risk Table                     Men   Women  1/2 Average Risk   3.4   3.3  Average Risk       5.0   4.4  2 X Average Risk   9.6   7.1  3 X Average Risk  23.4   11.0        Use the calculated Patient Ratio above and the CHD Risk Table to determine the patient's CHD Risk.        ATP III CLASSIFICATION (LDL):  <100     mg/dL   Optimal  784-696  mg/dL   Near or Above                    Optimal  130-159  mg/dL   Borderline  295-284  mg/dL   High  >132     mg/dL   Very High    Dg Chest Portable 1 View  Result Date: 05/22/2016 CLINICAL DATA:  Chest pain EXAM: PORTABLE CHEST 1 VIEW COMPARISON:  April 24, 2016 FINDINGS: The heart size and mediastinal contours are within normal limits. Both lungs are clear. The visualized skeletal structures are unremarkable. IMPRESSION: No active disease. Electronically Signed   By: Gerome Sam III M.D   On: 05/22/2016 15:58    ROS: General:no colds or fevers, no weight changes Skin:no rashes or ulcers HEENT:no blurred vision, no congestion CV:see HPI PUL:see HPI GI:no diarrhea constipation or melena, no indigestion GU:no hematuria, no dysuria MS:no joint pain, no claudication Neuro:no syncope, no lightheadedness Endo:no diabetes, no thyroid disease  Blood pressure 109/68, pulse 65, temperature 98.2 F (36.8 C), temperature source Oral, resp. rate 16, height 5\' 4"  (1.626 m), weight 163 lb 14.4 oz (74.3 kg), SpO2 98 %.  Wt Readings from Last 3 Encounters:  05/22/16 163 lb 14.4 oz (74.3 kg)  05/19/16 160 lb (72.6 kg)    PE: per Dr. Eden Emms General:Pleasant affect, NAD Skin:Warm and dry, brisk capillary refill HEENT:normocephalic, sclera clear, mucus membranes moist Neck:supple, no JVD, no bruits  Heart:S1S2 RRR without murmur, gallup, rub or click Lungs:clear without rales,  rhonchi, or wheezes GMW:NUUV,  non tender, + BS, do not palpate liver spleen or masses Ext:no lower ext edema, 2+ pedal pulses, 2+ radial pulses Neuro:alert and oriented, MAE, follows commands, + facial symmetry   Assessment/Plan Principal Problem:   Chest pain Active Problems:   Normocytic anemia   Severe episode of recurrent major depressive disorder, without psychotic features (HCC)  Chest pain- neg MI and EKG is normal, will do POET, Dr. Eden EmmsNishan has seen.   Nada BoozerLaura Ingold  Nurse Practitioner Certified [facility] Pager [phone] or after 5pm or weekends call [phone] 05/23/2016, 7:39 AM    Patient examined chart reviewed. Atypical pain R/O normal ECG Exam benign no murmur clear lungs Significant depression. Will order ETT this am if normal can be d/c back to behavioral health latter today Ok to eat for POET.  Charlton HawsPeter Shyheim Tanney

## 2016-05-23 NOTE — Plan of Care (Signed)
Problem: Cardiac: Goal: Ability to achieve and maintain adequate cardiovascular perfusion will improve Outcome: Progressing Scheduled for Cardiac Cath 12/19

## 2016-05-23 NOTE — Progress Notes (Signed)
Pt had chest tightness at 5 minutes on the treadmill. HR inadequate. Discussed with Dr Eden EmmsNishan, plan for diagnostic cath if she can be added on to the schedule today. The patient understands that risks included but are not limited to stroke (1 in 1000), death (1 in 1000), kidney failure [usually temporary] (1 in 500), bleeding (1 in 200), allergic reaction [possibly serious] (1 in 200).  The patient understands and agrees to proceed.   Corine ShelterLUKE Eshani Maestre PA-C 05/23/2016 10:17 AM

## 2016-05-23 NOTE — Progress Notes (Signed)
PROGRESS NOTE    Betty PresserDianne R Osborne  BJY:[id number] DOB: [birth year] DOA: 05/22/2016 PCP: Kirstie PeriSHAH,ASHISH, MD   Outpatient Specialists:     Brief Narrative:   Betty PinaDianne R Sheltonis a 45 y.o.femalewith medical history significant for major depressive disorder, anxiety, and insomnia who presents from the behavioral health Hospital for evaluation of acute chest pain. Patient was admitted to the behavioral health Hospital on 05/20/2016 for major depression without psychosis. She reports waking in her usual state of health, but developing acute onset of left-sided chest pain with radiation to the left jaw, neck, and left arm at approximately 1:55 PM. Pain was described as 10/10 in severity, pressure-like in character, constant, starting at rest, and continuing unchanged until sublingual nitroglycerin was given by EMS and reduce the severity to a "7/10." There was mild nausea associated with the pain, but no diaphoresis or dyspnea. Patient has had similar pain previously that was reportedly attributed to anxiety. She was previously treated with metformin, but this was recently discontinued. She does not have known coronary artery disease, but reports a family history of premature CAD as well as history of hypertrophic cardiomyopathy in her father. Patient takes atenolol daily, but no antilipid or antiplatelet. EMS was activated and the patient was treated with 324 mg aspirin and one sublingual nitroglycerin en route to the hospital.   Assessment & Plan:   Principal Problem:   Chest pain Active Problems:   Normocytic anemia   Severe episode of recurrent major depressive disorder, without psychotic features (HCC)   Chest pain  - Pt presents with acute chest pain, reports FHx of premature CAD - Initial EKG is without ischemic features and initial troponin is 0.01  - had chest pain during stress test--- for cath in AM  Major depressive disorder  - [facility]  - She  has hx of suicide attempt by overdose a few weeks ago, but denies any suicidal ideation on [facility], and again here - Plan to [facility] -psych consult for plans for after d/c-- back [facility]?  Normocytic anemia  - Hgb is 11.7 on admission with normal MCV and no s/s of bleeding  - She is postmenopausal; outpatient follow-up advised    DVT prophylaxis:    Code Status: Full Code   Family Communication: patient  Disposition Plan:     Consultants:   cards   Subjective: No current chest pain C/o HA and hunger  Objective: Vitals:   05/22/16 2000 05/23/16 0839  BP: 109/68 98/75  Pulse: 65 76  Resp: 16 20  Temp: 98.2 F (36.8 C) 98.1 F (36.7 C)  TempSrc: Oral Oral  SpO2: 98% 99%  Weight: 74.3 kg (163 lb 14.4 oz)   Height: 5\' 4"  (1.626 m)     Intake/Output Summary (Last 24 hours) at 05/23/16 1300 Last data filed at 05/23/16 1227  Gross per 24 hour  Intake              240 ml  Output             1500 ml  Net            -1260 ml   Filed Weights   05/22/16 2000  Weight: 74.3 kg (163 lb 14.4 oz)    Examination:  General exam: Appears calm and comfortable  Respiratory system: Clear to auscultation. Respiratory effort normal. Cardiovascular system: S1 & S2 heard, RRR. No JVD, murmurs, rubs, gallops or clicks.  No pedal edema. Gastrointestinal system: Abdomen is nondistended, soft and nontender. No organomegaly or masses felt. Normal bowel sounds heard. Central nervous system: Alert and oriented. No focal neurological deficits.     Data Reviewed: I have personally reviewed following labs and imaging studies  CBC:  Recent Labs Lab 05/19/16 1328 05/22/16 1653  WBC 10.0 7.4  NEUTROABS 5.8  --   HGB 13.6 11.7*  HCT 40.7 35.6*  MCV 88.1 89.2  PLT 298 247   Basic Metabolic Panel:  Recent Labs Lab 05/19/16 1328 05/21/16 0617 05/22/16 1653 05/23/16 1030  NA 132* 139 138 139  K 3.7 4.0 4.2 4.0  CL 96* 101  101 103  CO2 28 30 28 31   GLUCOSE 93 100* 94 95  BUN 13 16 11 11   CREATININE 0.80 0.85 0.90 0.83  CALCIUM 9.2 9.2 9.1 8.7*   GFR: Estimated Creatinine Clearance: 84.5 mL/min (by C-G formula based on SCr of 0.83 mg/dL). Liver Function Tests:  Recent Labs Lab 05/22/16 1653  AST 50*  ALT 50  ALKPHOS 41  BILITOT 0.5  PROT 6.1*  ALBUMIN 3.7    Recent Labs Lab 05/22/16 1653  LIPASE 46   No results for input(s): AMMONIA in the last 168 hours. Coagulation Profile: No results for input(s): INR, PROTIME in the last 168 hours. Cardiac Enzymes:  Recent Labs Lab 05/22/16 2101 05/23/16 0255 05/23/16 1030  TROPONINI <0.03 <0.03 <0.03   BNP (last 3 results) No results for input(s): PROBNP in the last 8760 hours. HbA1C: No results for input(s): HGBA1C in the last 72 hours. CBG:  Recent Labs Lab 05/22/16 2343 05/23/16 0749  GLUCAP 84 94   Lipid Profile:  Recent Labs  05/23/16 0255  CHOL 137  HDL 51  LDLCALC 73  TRIG 65  CHOLHDL 2.7   Thyroid Function Tests:  Recent Labs  05/21/16 0617  TSH 1.416   Anemia Panel:  Recent Labs  05/21/16 0617  VITAMINB12 411  FOLATE 20.9   Urine analysis: No results found for: COLORURINE, APPEARANCEUR, LABSPEC, PHURINE, GLUCOSEU, HGBUR, BILIRUBINUR, KETONESUR, PROTEINUR, UROBILINOGEN, NITRITE, LEUKOCYTESUR   ) Recent Results (from the past 240 hour(s))  MRSA PCR Screening     Status: None   Collection Time: 05/22/16  8:31 PM  Result Value Ref Range Status   MRSA by PCR NEGATIVE NEGATIVE Final    Comment:        The GeneXpert MRSA Assay (FDA approved for NASAL specimens only), is one component of a comprehensive MRSA colonization surveillance program. It is not intended to diagnose MRSA infection nor to guide or monitor treatment for MRSA infections.       Anti-infectives    None       Radiology Studies: Dg Chest Portable 1 View  Result Date: 05/22/2016 CLINICAL DATA:  Chest pain EXAM: PORTABLE  CHEST 1 VIEW COMPARISON:  April 24, 2016 FINDINGS: The heart size and mediastinal contours are within normal limits. Both lungs are clear. The visualized skeletal structures are unremarkable. IMPRESSION: No active disease. Electronically Signed   By: Gerome Samavid  Williams III M.D   On: 05/22/2016 15:58        Scheduled Meds: . aspirin EC  81 mg Oral Daily  . buPROPion  150 mg Oral Daily  . citalopram  10 mg Oral Daily  . enoxaparin (LOVENOX) injection  40 mg Subcutaneous Q24H  . gabapentin  800 mg Oral TID  . traZODone  50 mg Oral QHS   Continuous Infusions: . sodium chloride 75  mL/hr at 05/23/16 1058     LOS: 0 days    Time spent: 25 min    Mayukha Symmonds Juanetta Gosling, DO [facility] Pager [phone]  If 7PM-7AM, please contact night-coverage [email or web address] Password [email or web address] 05/23/2016, 1:00 PM

## 2016-05-23 NOTE — Plan of Care (Signed)
Problem: Safety: Goal: Ability to remain free from injury will improve Outcome: Completed/Met Date Met: 05/23/16 Reviewed safety and fall risk policy and made patient aware of call light and how to use it, camera in the room for patient safety and bed alarm and patient verbalized understanding of what and why we use these devices to help keep patients safe and is okay with bed alarm being on being in a camera room, will continue to monitor.

## 2016-05-23 NOTE — Telephone Encounter (Signed)
Lauren called wanting to sch appt for pt due to pt recently being seen at inpt. Informed Lauren that pt PCP sent office records for office to sch pt an appt but provider stated pt needed to get [facility]. Also informed Lauren that pt did call office stating that [facility]. Leotis ShamesLauren then stated pt was discharged from there 2 wks ago. RMA put Lauren on hold and reached out to provider to see if office should still sch appt for pt since she [facility]. Provider reviewed pt chart and instructed office to let Lauren know to see if they could ref pt to an intensive outpt treatment first due to pt recent ins and outs of inpt facilities/hospitals. Per Lauren she will try to see if pt is interested in that. Per Leotis ShamesLauren, even if they do get pt in for intensive outpt treatment, who will be taking over her care medications. Informed Lauren RMA was not sure and the provider only stated for pt to try intensive outpt treat first. Lauren then stated that due to office informing her theres no opening in 1 month for med management anyway's, she will ref pt elsewhere.

## 2016-05-23 NOTE — Clinical Social Work Note (Signed)
[facility]. [facility] CSW has made calls to request info on availability of appointments [facility]. CSW will continue to follow patient.  Roddie McBryant Carlotta Telfair MSW, [place]CSW, [place], [phone]

## 2016-05-23 NOTE — Progress Notes (Signed)
Unable to cath today secondary to schedule, added on for tomorrow.  Corine ShelterLUKE KILROY PA-C 05/23/2016 11:53 AM

## 2016-05-24 ENCOUNTER — Encounter (HOSPITAL_COMMUNITY)
Admission: EM | Disposition: A | Discharge status: Home / Self Care | Payer: Self-pay | Source: Ambulatory Visit | Attending: Internal Medicine

## 2016-05-24 ENCOUNTER — Encounter (HOSPITAL_COMMUNITY): Payer: Self-pay | Admitting: Cardiovascular Disease

## 2016-05-24 DIAGNOSIS — F332 Major depressive disorder, recurrent severe without psychotic features: Secondary | ICD-10-CM

## 2016-05-24 DIAGNOSIS — R0789 Other chest pain: Secondary | ICD-10-CM

## 2016-05-24 DIAGNOSIS — R9439 Abnormal result of other cardiovascular function study: Secondary | ICD-10-CM

## 2016-05-24 DIAGNOSIS — R072 Precordial pain: Principal | ICD-10-CM

## 2016-05-24 HISTORY — PX: CARDIAC CATHETERIZATION: SHX172

## 2016-05-24 LAB — CBC
HCT: 33.5 % — ABNORMAL LOW (ref 36.0–46.0)
Hemoglobin: 11 g/dL — ABNORMAL LOW (ref 12.0–15.0)
MCH: 28.9 pg (ref 26.0–34.0)
MCHC: 32.8 g/dL (ref 30.0–36.0)
MCV: 88.2 fL (ref 78.0–100.0)
Platelets: 244 10*3/uL (ref 150–400)
RBC: 3.8 MIL/uL — ABNORMAL LOW (ref 3.87–5.11)
RDW: 12.5 % (ref 11.5–15.5)
WBC: 6.8 10*3/uL (ref 4.0–10.5)

## 2016-05-24 LAB — BASIC METABOLIC PANEL
Anion gap: 7 (ref 5–15)
BUN: 11 mg/dL (ref 6–20)
CO2: 30 mmol/L (ref 22–32)
Calcium: 8.6 mg/dL — ABNORMAL LOW (ref 8.9–10.3)
Chloride: 103 mmol/L (ref 101–111)
Creatinine, Ser: 0.77 mg/dL (ref 0.44–1.00)
GFR calc Af Amer: >60 mL/min (ref >60)
GFR calc non Af Amer: >60 mL/min (ref >60)
Glucose, Bld: 101 mg/dL — ABNORMAL HIGH (ref 65–99)
Potassium: 4.3 mmol/L (ref 3.5–5.1)
Sodium: 140 mmol/L (ref 135–145)

## 2016-05-24 LAB — GLUCOSE, CAPILLARY
GLUCOSE-CAPILLARY: 93 mg/dL (ref 65–99)
GLUCOSE-CAPILLARY: 94 mg/dL (ref 65–99)
GLUCOSE-CAPILLARY: 93 mg/dL (ref 65–99)
Glucose-Capillary: 78 mg/dL (ref 65–99)

## 2016-05-24 LAB — PROTIME-INR
INR: 1.03
Prothrombin Time: 13.5 seconds (ref 11.4–15.2)

## 2016-05-24 SURGERY — LEFT HEART CATH AND CORONARY ANGIOGRAPHY

## 2016-05-24 MED ORDER — SODIUM CHLORIDE 0.9% FLUSH: 3.0000 mL | INTRAVENOUS | Status: DC | PRN

## 2016-05-24 MED ORDER — SODIUM CHLORIDE 0.9 % IV SOLN: 250.0000 mL | INTRAVENOUS | Status: DC | PRN

## 2016-05-24 MED ORDER — LIDOCAINE HCL (PF) 1 % IJ SOLN
INTRAMUSCULAR | Status: DC | PRN
  Administered 2016-05-24: 3 mL

## 2016-05-24 MED ORDER — TRAZODONE HCL 50 MG PO TABS: 50.0000 mg | ORAL_TABLET | Freq: Every day | ORAL | 0 refills | Status: DC

## 2016-05-24 MED ORDER — HEPARIN SODIUM (PORCINE) 1000 UNIT/ML IJ SOLN
INTRAMUSCULAR | Status: DC | PRN
  Administered 2016-05-24: 3500 [IU] via INTRAVENOUS

## 2016-05-24 MED ORDER — LORAZEPAM 0.5 MG PO TABS: 0.5000 mg | ORAL_TABLET | Freq: Two times a day (BID) | ORAL | 0 refills | Status: AC | PRN

## 2016-05-24 MED ORDER — HEPARIN (PORCINE) IN NACL 2-0.9 UNIT/ML-% IJ SOLN
INTRAMUSCULAR | Status: DC | PRN
  Administered 2016-05-24: 1000 mL

## 2016-05-24 MED ORDER — CITALOPRAM HYDROBROMIDE 10 MG PO TABS: 10.0000 mg | ORAL_TABLET | Freq: Every day | ORAL | 0 refills | Status: DC

## 2016-05-24 MED ORDER — VERAPAMIL HCL 2.5 MG/ML IV SOLN
INTRAVENOUS | Status: DC | PRN
  Administered 2016-05-24: 10 mL via INTRA_ARTERIAL

## 2016-05-24 MED ORDER — SODIUM CHLORIDE 0.9 % IV SOLN
INTRAVENOUS | Status: AC
Start: 2016-05-24 — End: 2016-05-24
  Administered 2016-05-24: 09:00:00 via INTRAVENOUS

## 2016-05-24 MED ORDER — SODIUM CHLORIDE 0.9 % IV SOLN
INTRAVENOUS | Status: DC | PRN
  Administered 2016-05-24: 250 mL via INTRAVENOUS

## 2016-05-24 MED ORDER — METFORMIN HCL 1000 MG PO TABS: 1000.0000 mg | ORAL_TABLET | Freq: Two times a day (BID) | ORAL | 0 refills | Status: AC

## 2016-05-24 MED ORDER — FENTANYL CITRATE (PF) 100 MCG/2ML IJ SOLN
INTRAMUSCULAR | Status: DC | PRN
  Administered 2016-05-24: 25 ug via INTRAVENOUS

## 2016-05-24 MED ORDER — IOPAMIDOL (ISOVUE-370) INJECTION 76%
INTRAVENOUS | Status: DC | PRN
  Administered 2016-05-24: 80 mL via INTRA_ARTERIAL

## 2016-05-24 MED ORDER — BUPROPION HCL ER (XL) 150 MG PO TB24: 150.0000 mg | ORAL_TABLET | Freq: Every day | ORAL | 0 refills | Status: DC

## 2016-05-24 MED ORDER — MIDAZOLAM HCL 2 MG/2ML IJ SOLN
INTRAMUSCULAR | Status: DC | PRN
  Administered 2016-05-24: 1 mg via INTRAVENOUS

## 2016-05-24 MED ORDER — GABAPENTIN 400 MG PO CAPS: 800.0000 mg | ORAL_CAPSULE | Freq: Three times a day (TID) | ORAL | 0 refills | Status: DC

## 2016-05-24 MED ORDER — PNEUMOCOCCAL VAC POLYVALENT 25 MCG/0.5ML IJ INJ
0.5000 mL | INJECTION | Freq: Once | INTRAMUSCULAR | Status: AC
  Administered 2016-05-24: 0.5 mL via INTRAMUSCULAR
  Filled 2016-05-24: qty 0.5

## 2016-05-24 MED ORDER — SODIUM CHLORIDE 0.9% FLUSH
3.0000 mL | Freq: Two times a day (BID) | INTRAVENOUS | Status: DC
  Administered 2016-05-24: 3 mL via INTRAVENOUS

## 2016-05-24 SURGICAL SUPPLY — 10 items
CATH 5FR JL3.5 JR4 ANG PIG MP (CATHETERS) ×2 IMPLANT
DEVICE RAD COMP TR BAND LRG (VASCULAR PRODUCTS) ×2 IMPLANT
GLIDESHEATH SLEND SS 6F .021 (SHEATH) ×2 IMPLANT
GUIDEWIRE INQWIRE 1.5J.035X260 (WIRE) IMPLANT
INQWIRE 1.5J .035X260CM (WIRE) ×3
KIT HEART LEFT (KITS) ×3 IMPLANT
PACK CARDIAC CATHETERIZATION (CUSTOM PROCEDURE TRAY) ×3 IMPLANT
SYR MEDRAD MARK V 150ML (SYRINGE) ×3 IMPLANT
TRANSDUCER W/STOPCOCK (MISCELLANEOUS) ×3 IMPLANT
TUBING CIL FLEX 10 FLL-RA (TUBING) ×3 IMPLANT

## 2016-05-24 NOTE — Clinical Social Work Note (Signed)
CSW received call from [facility] social worker regarding patient's outpatient appointment. Appointment information reviewed with the patient. Patient will complete outpatient f/u and will go home with 30 day supply of medications. CSW signing off at this time.   Roddie McBryant Sherleen Pangborn MSW, [place]CSW, [place], [phone]

## 2016-05-24 NOTE — Interval H&P Note (Signed)
History and Physical Interval Note:  05/24/2016 7:25 AM  Betty Osborne  has presented today for cardiac cath with the diagnosis of chest pain/abnormal stress test. The various methods of treatment have been discussed with the patient and family. After consideration of risks, benefits and other options for treatment, the patient has consented to  Procedure(s): Left Heart Cath and Coronary Angiography (N/A) as a surgical intervention .  The patient's history has been reviewed, patient examined, no change in status, stable for surgery.  I have reviewed the patient's chart and labs.  Questions were answered to the patient's satisfaction.    Cath Lab Visit (complete for each Cath Lab visit)  Clinical Evaluation Leading to the Procedure:   ACS: No.  Non-ACS:    Anginal Classification: CCS III  Anti-ischemic medical therapy: Minimal Therapy (1 class of medications)  Non-Invasive Test Results: Intermediate-risk stress test findings: cardiac mortality 1-3%/year  Prior CABG: No previous CABG         Verne Carrowhristopher Tahj Lindseth

## 2016-05-24 NOTE — Discharge Summary (Signed)
Physician Discharge Summary  Betty Osborne [id number] DOB: January 14, 1971 DOA: 05/22/2016  PCP: Betty Osborne  Admit date: 05/22/2016 Discharge date: 05/24/2016   Recommendations for Outpatient Follow-Up:   [facility] but instead intensive outpatient therapy 30 days of prescriptions given-- 1st available appointment with psych arranged, patient may need refill of script before then  Discharge Diagnosis:   Principal Problem:   MDD (major depressive disorder), recurrent severe, without psychosis (HCC) Active Problems:   Precordial chest pain   Normocytic anemia   Severe episode of recurrent major depressive disorder, without psychotic features (HCC)   Abnormal cardiovascular stress test   Discharge disposition:  Home.   Discharge Condition: Improved.  Diet recommendation: Low sodium, heart healthy.  Carbohydrate-modified.  Wound care: None.   History of Present Illness:    Betty PinaDianne R Sheltonis a 45 y.o.femalewith medical history significant for major depressive disorder, anxiety, and insomnia who presents from the behavioral health Hospital for evaluation of acute chest pain. Patient was admitted to the behavioral health Hospital on 05/20/2016 for major depression without psychosis. She reports waking in her usual state of health, but developing acute onset of left-sided chest pain with radiation to the left jaw, neck, and left arm at approximately 1:55 PM. Pain was described as 10/10 in severity, pressure-like in character, constant, starting at rest, and continuing unchanged until sublingual nitroglycerin was given by EMS and reduce the severity to a "7/10." There was mild nausea associated with the pain, but no diaphoresis or dyspnea. Patient has had similar pain previously that was reportedly attributed to anxiety. She was previously treated with metformin, but this was recently discontinued. She does not have known coronary artery disease, but reports  a family history of premature CAD as well as history of hypertrophic cardiomyopathy in her father. Patient takes atenolol daily, but no antilipid or antiplatelet. EMS was activated and the patient was treated with 324 mg aspirin and one sublingual nitroglycerin en route to the hospital.   Hospital Course by Problem:   Chest pain  - Pt presents with acute chest pain, reports FHx of premature CAD - cath: no CAD  Major depressive disorder  - [facility]  - seen by psych-- to d/c on current meds with outpatient follow up  Normocytic anemia  - Hgb is 11.7 on admission with normal MCV and no s/s of bleeding  - She is postmenopausal; outpatient follow-up advised     Medical Consultants:    Cards  psych   Discharge Exam:   Vitals:   05/24/16 0805 05/24/16 0810  BP: 103/66 106/68  Pulse: 80 76  Resp: 20 15  Temp:     Vitals:   05/24/16 0756 05/24/16 0801 05/24/16 0805 05/24/16 0810  BP: 101/65 101/64 103/66 106/68  Pulse: 71 80 80 76  Resp: (!) 22 (!) 23 20 15   Temp:      TempSrc:      SpO2: 100% 99% 99% 100%  Weight:      Height:        Gen:  NAD    The results of significant diagnostics from this hospitalization (including imaging, microbiology, ancillary and laboratory) are listed below for reference.     Procedures and Diagnostic Studies:   Dg Chest Portable 1 View  Result Date: 05/22/2016 CLINICAL DATA:  Chest pain EXAM: PORTABLE CHEST 1 VIEW COMPARISON:  April 24, 2016 FINDINGS: The heart size and mediastinal contours are within normal limits.  Both lungs are clear. The visualized skeletal structures are unremarkable. IMPRESSION: No active disease. Electronically Signed   By: Gerome Samavid  Williams III M.D   On: 05/22/2016 15:58     Labs:   Basic Metabolic Panel:  Recent Labs Lab 05/19/16 1328 05/21/16 0617 05/22/16 1653 05/23/16 1030 05/24/16 0331  NA 132* 139 138 139 140  K 3.7 4.0 4.2 4.0 4.3  CL 96* 101  101 103 103  CO2 28 30 28 31 30   GLUCOSE 93 100* 94 95 101*  BUN 13 16 11 11 11   CREATININE 0.80 0.85 0.90 0.83 0.77  CALCIUM 9.2 9.2 9.1 8.7* 8.6*   GFR Estimated Creatinine Clearance: 87.6 mL/min (by C-G formula based on SCr of 0.77 mg/dL). Liver Function Tests:  Recent Labs Lab 05/22/16 1653  AST 50*  ALT 50  ALKPHOS 41  BILITOT 0.5  PROT 6.1*  ALBUMIN 3.7    Recent Labs Lab 05/22/16 1653  LIPASE 46   No results for input(s): AMMONIA in the last 168 hours. Coagulation profile  Recent Labs Lab 05/24/16 0331  INR 1.03    CBC:  Recent Labs Lab 05/19/16 1328 05/22/16 1653 05/24/16 0331  WBC 10.0 7.4 6.8  NEUTROABS 5.8  --   --   HGB 13.6 11.7* 11.0*  HCT 40.7 35.6* 33.5*  MCV 88.1 89.2 88.2  PLT 298 247 244   Cardiac Enzymes:  Recent Labs Lab 05/22/16 2101 05/23/16 0255 05/23/16 1030  TROPONINI <0.03 <0.03 <0.03   BNP: Invalid input(s): POCBNP CBG:  Recent Labs Lab 05/22/16 2343 05/23/16 0749 05/24/16 0840  GLUCAP 84 94 94   D-Dimer No results for input(s): DDIMER in the last 72 hours. Hgb A1c No results for input(s): HGBA1C in the last 72 hours. Lipid Profile  Recent Labs  05/23/16 0255  CHOL 137  HDL 51  LDLCALC 73  TRIG 65  CHOLHDL 2.7   Thyroid function studies No results for input(s): TSH, T4TOTAL, T3FREE, THYROIDAB in the last 72 hours.  Invalid input(s): FREET3 Anemia work up No results for input(s): VITAMINB12, FOLATE, FERRITIN, TIBC, IRON, RETICCTPCT in the last 72 hours. Microbiology Recent Results (from the past 240 hour(s))  MRSA PCR Screening     Status: None   Collection Time: 05/22/16  8:31 PM  Result Value Ref Range Status   MRSA by PCR NEGATIVE NEGATIVE Final    Comment:        The GeneXpert MRSA Assay (FDA approved for NASAL specimens only), is one component of a comprehensive MRSA colonization surveillance program. It is not intended to diagnose MRSA infection nor to guide or monitor treatment  for MRSA infections.      Discharge Instructions:   Discharge Instructions    Diet - low sodium heart healthy    Complete by:  As directed    Diet Carb Modified    Complete by:  As directed    Discharge instructions    Complete by:  As directed    Stress management- yoga, meditation Close outpatient therapy   Increase activity slowly    Complete by:  As directed      Allergies as of 05/24/2016      Reactions   Dilaudid [hydromorphone Hcl]       Medication List    STOP taking these medications   atenolol 50 MG tablet Commonly known as:  TENORMIN   DULoxetine 60 MG capsule Commonly known as:  CYMBALTA   hydrOXYzine 25 MG capsule Commonly known as:  VISTARIL  tiZANidine 4 MG tablet Commonly known as:  ZANAFLEX     TAKE these medications   benzonatate 100 MG capsule Commonly known as:  TESSALON Take 100 mg by mouth 3 (three) times daily as needed for cough.   buPROPion 150 MG 24 hr tablet Commonly known as:  WELLBUTRIN XL Take 1 tablet (150 mg total) by mouth daily.   citalopram 10 MG tablet Commonly known as:  CELEXA Take 1 tablet (10 mg total) by mouth daily.   fluticasone 50 MCG/ACT nasal spray Commonly known as:  FLONASE Place 2 sprays into both nostrils daily.   gabapentin 400 MG capsule Commonly known as:  NEURONTIN Take 2 capsules (800 mg total) by mouth 3 (three) times daily.   LORazepam 0.5 MG tablet Commonly known as:  ATIVAN Take 1 tablet (0.5 mg total) by mouth 2 (two) times daily as needed for anxiety.   metFORMIN 1000 MG tablet Commonly known as:  GLUCOPHAGE Take 1 tablet (1,000 mg total) by mouth 2 (two) times daily with a meal.   polycarbophil 625 MG tablet Commonly known as:  FIBERCON Take 625 mg by mouth 2 (two) times daily.   traZODone 50 MG tablet Commonly known as:  DESYREL Take 1 tablet (50 mg total) by mouth at bedtime.         Time coordinating discharge: 35 min  Signed:  Tyyne Cliett U Jahvon Gosline   [facility] 05/24/2016, 9:37 AM

## 2016-05-24 NOTE — Progress Notes (Signed)
CSW has been in contact [facility] planning. CSW secured appt with [facility] and provided another therapy resource if needed. Pt will be able to receive outpatient medication management and therapy through this program.  Vernie ShanksLauren Jene Huq, LCSW Clinical Social Work [phone]

## 2016-05-24 NOTE — Progress Notes (Signed)
Patient ID: Betty PresserDianne R Craighead, female   DOB: [birth year], 45 y.o.   MRN: [id number]   Patient Name: Betty Osborne Date of Encounter: 05/24/2016  [facility]   Hospital Problem List     Principal Problem:   MDD (major depressive disorder), recurrent severe, without psychosis (HCC) Active Problems:   Chest pain   Normocytic anemia   Severe episode of recurrent major depressive disorder, without psychotic features (HCC)     Subjective   Going for cath   Inpatient Medications    Scheduled Meds: . [MAR Hold] aspirin EC  81 mg Oral Daily  . [MAR Hold] buPROPion  150 mg Oral Daily  . [MAR Hold] citalopram  10 mg Oral Daily  . [MAR Hold] enoxaparin (LOVENOX) injection  40 mg Subcutaneous Q24H  . [MAR Hold] gabapentin  800 mg Oral TID  . [MAR Hold] traZODone  50 mg Oral QHS   Continuous Infusions: . sodium chloride 75 mL/hr at 05/23/16 1930  . sodium chloride 1 mL/kg/hr (05/24/16 0649)   PRN Meds: [MAR Hold] acetaminophen, [MAR Hold] alum & mag hydroxide-simeth, [MAR Hold] benzonatate, [MAR Hold] LORazepam, [MAR Hold] nitroGLYCERIN, [MAR Hold] ondansetron (ZOFRAN) IV, [MAR Hold] pneumococcal 23 valent vaccine, [MAR Hold] traMADol   Vital Signs    Vitals:   05/23/16 0839 05/23/16 1445 05/23/16 1930 05/24/16 0548  BP: 98/75 106/70 (!) 96/56   Pulse: 76 77  72  Resp: 20   17  Temp: 98.1 F (36.7 C) 98.7 F (37.1 C)  97.8 F (36.6 C)  TempSrc: Oral Oral  Oral  SpO2: 99%   99%  Weight:    163 lb 9.6 oz (74.2 kg)  Height:        Intake/Output Summary (Last 24 hours) at 05/24/16 0739 Last data filed at 05/24/16 0651  Gross per 24 hour  Intake             1720 ml  Output             6950 ml  Net            -5230 ml   Filed Weights   05/22/16 2000 05/24/16 0548  Weight: 163 lb 14.4 oz (74.3 kg) 163 lb 9.6 oz (74.2 kg)    Physical Exam    GEN: Well nourished, well developed, in no acute distress.  HEENT: Grossly normal.  Neck: Supple, no JVD,  carotid bruits, or masses. Cardiac: RRR, no murmurs, rubs, or gallops. No clubbing, cyanosis, edema.  Radials/DP/PT 2+ and equal bilaterally.  Respiratory:  Respirations regular and unlabored, clear to auscultation bilaterally. GI: Soft, nontender, nondistended, BS + x 4. MS: no deformity or atrophy. Skin: warm and dry, no rash. Neuro:  Strength and sensation are intact. Psych: AAOx3.  Normal affect.  Labs    CBC  Recent Labs  05/22/16 1653 05/24/16 0331  WBC 7.4 6.8  HGB 11.7* 11.0*  HCT 35.6* 33.5*  MCV 89.2 88.2  PLT 247 244   Basic Metabolic Panel  Recent Labs  05/23/16 1030 05/24/16 0331  NA 139 140  K 4.0 4.3  CL 103 103  CO2 31 30  GLUCOSE 95 101*  BUN 11 11  CREATININE 0.83 0.77  CALCIUM 8.7* 8.6*   Liver Function Tests  Recent Labs  05/22/16 1653  AST 50*  ALT 50  ALKPHOS 41  BILITOT 0.5  PROT 6.1*  ALBUMIN 3.7    Recent Labs  05/22/16 1653  LIPASE 46   Cardiac Enzymes  Recent Labs  05/22/16 2101 05/23/16 0255 05/23/16 1030  TROPONINI <0.03 <0.03 <0.03   BNP Invalid input(s): POCBNP D-Dimer No results for input(s): DDIMER in the last 72 hours. Hemoglobin A1C No results for input(s): HGBA1C in the last 72 hours. Fasting Lipid Panel  Recent Labs  05/23/16 0255  CHOL 137  HDL 51  LDLCALC 73  TRIG 65  CHOLHDL 2.7   Thyroid Function Tests No results for input(s): TSH, T4TOTAL, T3FREE, THYROIDAB in the last 72 hours.  Invalid input(s): FREET3  Telemetry    NSR 05/24/2016  - Personally Reviewed  ECG    NSR normal  - Personally Reviewed  Radiology    Dg Chest Portable 1 View  Result Date: 05/22/2016 CLINICAL DATA:  Chest pain EXAM: PORTABLE CHEST 1 VIEW COMPARISON:  April 24, 2016 FINDINGS: The heart size and mediastinal contours are within normal limits. Both lungs are clear. The visualized skeletal structures are unremarkable. IMPRESSION: No active disease. Electronically Signed   By: Gerome Samavid  Williams III M.D    On: 05/22/2016 15:58    Cardiac Studies   ETT poor functional capacity with SSCP stopped prematurely   Patient Profile     45 y.o. major depressive disorder SSCP r/o ETT with SSCP stopped before [facility]  Assessment & Plan    Depression: d/c back to behavioral health today if cath ok if CAD will need to avoid TCA's   SSCP:  Chest pain with ETT non diagnostic cath this am with CM   Signed, Charlton HawsPeter Sausha Raymond, MD  05/24/2016, 7:39 AM

## 2016-05-24 NOTE — H&P (View-Only) (Signed)
Reason for Consult: chest pain  Referring Physician: Dr. Benjamine MolaVann  PCP:  Kirstie PeriSHAH,ASHISH, MD  Primary Cardiologist:new- had appt with Dr. Antoine PocheHochrein in past - it was cancelled.  Betty Osborne is an 45 y.o. female.    [facility]   HPI:   Asked to see [age] yof with no prior cardiac hx though in July 2017 pt had appt with Dr. Antoine PocheHochrein but she did not make the appointment.     [name].  She has major depressive disorder, anxiety and insomnia - She had developed acute onset of Lt sided chest pain with radiation to Lt jaw, neck and lt arm.  NTG reduced pain from 10 to 7.  + mild nausea, no diaphoresis or dyspnea.   Hx of premature CAD in father.    Currently.  EKG SR no acute changes, stable.  troponins are neg.  LDL 73 TSH 1.416.   Past Medical History:  Diagnosis Date  . Anxiety and depression   . Fibromyalgia   . IBS (irritable bowel syndrome)   . Overdose   . Tachycardia     Past Surgical History:  Procedure Laterality Date  . APPENDECTOMY    . CHOLECYSTECTOMY      Family History  Problem Relation Age of Onset  . Heart disease Father     had a heart transplant in 2008   Social History:  reports that she has quit smoking. She has never used smokeless tobacco. She reports that she does not use drugs. Her alcohol history is not on file.  Allergies:  Allergies  Allergen Reactions  . Dilaudid [Hydromorphone Hcl]     OUTPATIENT MEDICATIONS: No current facility-administered medications on file prior to encounter.    Current Outpatient Prescriptions on File Prior to Encounter  Medication Sig Dispense Refill  . atenolol (TENORMIN) 50 MG tablet Take 50 mg by mouth daily.    . benzonatate (TESSALON) 100 MG capsule Take 100 mg by mouth 3 (three) times daily as needed for cough.    . DULoxetine (CYMBALTA) 60 MG capsule Take 90 mg by mouth daily.   1  . fluticasone (FLONASE) 50  MCG/ACT nasal spray Place 2 sprays into both nostrils daily.    Marland Kitchen. gabapentin (NEURONTIN) 400 MG capsule Take 800 mg by mouth 3 (three) times daily.    . hydrOXYzine (VISTARIL) 25 MG capsule Take 25 mg by mouth 3 (three) times daily.  0  . metFORMIN (GLUCOPHAGE) 1000 MG tablet Take 1,000 mg by mouth 2 (two) times daily with a meal.    . polycarbophil (FIBERCON) 625 MG tablet Take 625 mg by mouth 2 (two) times daily.    Marland Kitchen. tiZANidine (ZANAFLEX) 4 MG tablet Take 4 mg by mouth 4 (four) times daily.      CURRENT MEDICATIONS: Scheduled Meds: . aspirin EC  81 mg Oral Daily  . atenolol  50 mg Oral Daily  . DULoxetine  90 mg Oral Daily  . enoxaparin (LOVENOX) injection  40 mg Subcutaneous Q24H  . gabapentin  800 mg Oral TID  . hydrOXYzine  25 mg Oral TID  . traZODone  50 mg Oral QHS   Continuous Infusions: PRN Meds:.acetaminophen, ALPRAZolam, alum & mag hydroxide-simeth, benzonatate, nitroGLYCERIN, ondansetron (ZOFRAN) IV, pneumococcal 23 valent vaccine, traMADol   Results for orders placed or performed during the hospital encounter of 05/22/16 (from the past 48 hour(s))  MRSA PCR  Screening     Status: None   Collection Time: 05/22/16  8:31 PM  Result Value Ref Range   MRSA by PCR NEGATIVE NEGATIVE    Comment:        The GeneXpert MRSA Assay (FDA approved for NASAL specimens only), is one component of a comprehensive MRSA colonization surveillance program. It is not intended to diagnose MRSA infection nor to guide or monitor treatment for MRSA infections.   Troponin I     Status: None   Collection Time: 05/22/16  9:01 PM  Result Value Ref Range   Troponin I <0.03 <0.03 ng/mL  Troponin I     Status: None   Collection Time: 05/23/16  2:55 AM  Result Value Ref Range   Troponin I <0.03 <0.03 ng/mL  Lipid panel     Status: None   Collection Time: 05/23/16  2:55 AM  Result Value Ref Range   Cholesterol 137 0 - 200 mg/dL   Triglycerides 65 <308 mg/dL   HDL 51 >65 mg/dL   Total  CHOL/HDL Ratio 2.7 RATIO   VLDL 13 0 - 40 mg/dL   LDL Cholesterol 73 0 - 99 mg/dL    Comment:        Total Cholesterol/HDL:CHD Risk Coronary Heart Disease Risk Table                     Men   Women  1/2 Average Risk   3.4   3.3  Average Risk       5.0   4.4  2 X Average Risk   9.6   7.1  3 X Average Risk  23.4   11.0        Use the calculated Patient Ratio above and the CHD Risk Table to determine the patient's CHD Risk.        ATP III CLASSIFICATION (LDL):  <100     mg/dL   Optimal  784-696  mg/dL   Near or Above                    Optimal  130-159  mg/dL   Borderline  295-284  mg/dL   High  >132     mg/dL   Very High    Dg Chest Portable 1 View  Result Date: 05/22/2016 CLINICAL DATA:  Chest pain EXAM: PORTABLE CHEST 1 VIEW COMPARISON:  April 24, 2016 FINDINGS: The heart size and mediastinal contours are within normal limits. Both lungs are clear. The visualized skeletal structures are unremarkable. IMPRESSION: No active disease. Electronically Signed   By: Gerome Sam III M.D   On: 05/22/2016 15:58    ROS: General:no colds or fevers, no weight changes Skin:no rashes or ulcers HEENT:no blurred vision, no congestion CV:see HPI PUL:see HPI GI:no diarrhea constipation or melena, no indigestion GU:no hematuria, no dysuria MS:no joint pain, no claudication Neuro:no syncope, no lightheadedness Endo:no diabetes, no thyroid disease  Blood pressure 109/68, pulse 65, temperature 98.2 F (36.8 C), temperature source Oral, resp. rate 16, height 5\' 4"  (1.626 m), weight 163 lb 14.4 oz (74.3 kg), SpO2 98 %.  Wt Readings from Last 3 Encounters:  05/22/16 163 lb 14.4 oz (74.3 kg)  05/19/16 160 lb (72.6 kg)    PE: per Dr. Eden Emms General:Pleasant affect, NAD Skin:Warm and dry, brisk capillary refill HEENT:normocephalic, sclera clear, mucus membranes moist Neck:supple, no JVD, no bruits  Heart:S1S2 RRR without murmur, gallup, rub or click Lungs:clear without rales,  rhonchi, or wheezes GMW:NUUV,  non tender, + BS, do not palpate liver spleen or masses Ext:no lower ext edema, 2+ pedal pulses, 2+ radial pulses Neuro:alert and oriented, MAE, follows commands, + facial symmetry   Assessment/Plan Principal Problem:   Chest pain Active Problems:   Normocytic anemia   Severe episode of recurrent major depressive disorder, without psychotic features (HCC)  Chest pain- neg MI and EKG is normal, will do POET, Dr. Eden EmmsNishan has seen.   Nada BoozerLaura Ingold  Nurse Practitioner Certified [facility] Pager [phone] or after 5pm or weekends call [phone] 05/23/2016, 7:39 AM    Patient examined chart reviewed. Atypical pain R/O normal ECG Exam benign no murmur clear lungs Significant depression. Will order ETT this am if normal can be d/c back to behavioral health latter today Ok to eat for POET.  Charlton HawsPeter Mollyann Halbert

## 2016-05-24 NOTE — Plan of Care (Signed)
Problem: Education: Goal: Understanding of cardiac disease, CV risk reduction, and recovery process will improve Outcome: Progressing Reviewed heart catheterization, reason for it, risks and benefits, procedure and care of incision site post cath, answered questions and patient verbalized understanding.

## 2016-05-24 NOTE — Progress Notes (Signed)
The client has been given her discharge instruction along with new medications. Hard prescriptions have been given to pick up medications. Heart cath site care instructions have been given. Telemetry was removed and CCMD notified. The clients family will be driving her home via car.   Sheppard Evensina Bralon Antkowiak RN

## 2016-05-24 NOTE — Progress Notes (Signed)
Updated report received via Otelia SanteeFarrah RN in patient's room using SBAR format, reviewed new orders, VS, tests, procedures and events of the day, assumed care of patient.

## 2016-06-07 ENCOUNTER — Other Ambulatory Visit (HOSPITAL_COMMUNITY): Payer: BLUE CROSS/BLUE SHIELD | Attending: Psychiatry | Admitting: Psychiatry

## 2016-06-07 ENCOUNTER — Encounter (HOSPITAL_COMMUNITY): Payer: Self-pay | Admitting: Psychiatry

## 2016-06-07 DIAGNOSIS — F332 Major depressive disorder, recurrent severe without psychotic features: Secondary | ICD-10-CM | POA: Insufficient documentation

## 2016-06-07 NOTE — Progress Notes (Signed)
    Daily Group Progress Note  Program: IOP  Group Time: 9:00-12:00  Participation Level: Active  Behavioral Response: Appropriate  Type of Therapy:  Group Therapy  Summary of Progress: Pt. Met with case manager during first half of group. Pt. Introduced herself to the group, presented as alert and engaged in the group process. Pt. Participated in guided meditation with the group. Pt. Discussed that she did not have experience with guided meditation and shared with the group that concentration was a challenge for her right now because of her depression.       Brown, Jennifer B, LPC 

## 2016-06-07 NOTE — Progress Notes (Signed)
Comprehensive Clinical Assessment (CCA) Note  06/07/2016 Betty Osborne [id number]  Visit Diagnosis:      ICD-9-CM ICD-10-CM   1. MDD (major depressive disorder), recurrent severe, without psychosis (HCC) 296.33 F33.2       CCA Part One  Part One has been completed on paper by the patient.  (See scanned document in Chart Review)  CCA Part Two A  Intake/Chief Complaint:  CCA Intake With Chief Complaint CCA Part Two Date: 06/07/16 CCA Part Two Time: 1412 Chief Complaint/Presenting Problem: This is a 46 yr old, married, unemployed, Caucasian female who was transitioned from the inpatient psychiatric unit; treatment for worsening depressive and anxiety symptoms.  According to pt, she was only at [facility] for one day before she was moved to [facility] with c/o chest pain.  Pt was at [facility] for four days.  Pt reports a long hx of struggling with depression and anxiety.  According to pt, she has been doing ok until she stopped taking all her medications.  It is unclear as to why pt chose to stop, but she did mention that her family was commenting how strong she was and didn't need it.  Pt also added that her PCP stopped and changed some of her medications.  "I haven't been feeling right ever since the change."  Pt reports she started back having SI.  Currently denies SI.  Also, denies HI or A/V hallucinations.  Pt has had three previous suicide attempts (three yrs ago she OD, June 2017 cut wrists, Nov. 19, 2017 OD).  Two prior psych hospitalizations:  Three yrs ago at [facility] and Nov. 2107 [facility].  Hx of seeing a psychologist for six months.  Stressors/Triggers:  1)  Unresolved grief/loss issues:  13-Jul-2016mother died.  "She was my best friend."  Father resides next door to pt.  Eight yrs ago (Jan. 17th) he has a heart transplant and is in need of back surgery.  Pt is very worried about him.  2)  Strained finances d/t excessive hospital bills.  Pt states she has been in and out of the hospital  due to IBS issues.  3)  Health Issues:  IBS, Arthritis in knees and hips, Fibromyalgia.  Pt has a hx of struggling with an eating d/o.  Pt states she's been stress eating and has gained 33 lbs within six months.  "So my wt issues are affecting my self esteem a lot.  I feel fat."  The stress is feeding into her anxiety thus pt is very anxious about her health.  She has had a stress test, etc.  "The doctor said my heart is fine."  Family Hx:  Maternal GF and Paternal Aunt:  Depression and Anxiety; Paternal Uncle:  ETOH.                                                                                                                  Patients Currently Reported Symptoms/Problems: Anxious, poor sleep, sadness, tearful, poor concentration, irritable, decreased energy, no  motivation, isolative, ruminating thoughts, poor self-esteem, feelings of worthlessness Collateral Involvement: Husaband and family are very supportive Individual's Strengths: Pt is motivated to get better. Individual's Abilities: Patient is talkative and open. Type of Services Patient Feels Are Needed: MH-IOP  Mental Health Symptoms Depression:  Depression: Change in energy/activity, Difficulty Concentrating, Increase/decrease in appetite, Irritability, Sleep (too much or little), Tearfulness, Weight gain/loss, Worthlessness  Mania:  Mania: N/A  Anxiety:   Anxiety: Worrying, Irritability, Difficulty concentrating  Psychosis:  Psychosis: N/A  Trauma:  Trauma: N/A  Obsessions:  Obsessions: N/A  Compulsions:     Inattention:     Hyperactivity/Impulsivity:     Oppositional/Defiant Behaviors:  Oppositional/Defiant Behaviors: N/A  Borderline Personality:     Other Mood/Personality Symptoms:      Mental Status Exam Appearance and self-care  Stature:  Stature: Average  Weight:  Weight: Overweight  Clothing:  Clothing: Casual  Grooming:  Grooming: Well-groomed  Cosmetic use:  Cosmetic Use: Age appropriate  Posture/gait:  Posture/Gait:  Normal  Motor activity:  Motor Activity: Not Remarkable  Sensorium  Attention:  Attention: Normal  Concentration:  Concentration: Anxiety interferes  Orientation:  Orientation: X5  Recall/memory:  Recall/Memory: Normal  Affect and Mood  Affect:  Affect: Anxious  Mood:  Mood: Depressed  Relating  Eye contact:  Eye Contact: Normal  Facial expression:  Facial Expression: Sad  Attitude toward examiner:  Attitude Toward Examiner: Cooperative  Thought and Language  Speech flow: Speech Flow: Normal  Thought content:  Thought Content: Appropriate to mood and circumstances  Preoccupation:     Hallucinations:     Organization:     Company secretaryxecutive Functions  Fund of Knowledge:  Fund of Knowledge: Average  Intelligence:  Intelligence: Average  Abstraction:  Abstraction: Normal  Judgement:  Judgement: Normal  Reality Testing:  Reality Testing: Adequate  Insight:  Insight: Good  Decision Making:  Decision Making: Normal  Social Functioning  Social Maturity:  Social Maturity: Isolates  Social Judgement:  Social Judgement: Normal  Stress  Stressors:  Stressors: Veterinary surgeonGrief/losses, Scientist, forensicMoney  Coping Ability:  Coping Ability: Building surveyorverwhelmed  Skill Deficits:     Supports:      Family and Psychosocial History: Family history Marital status: Married Number of Years Married: 25 What types of issues is patient dealing with in the relationship?: good relationship with husband Additional relationship information: He is very supportive. Are you sexually active?: Yes What is your sexual orientation?: Heterosexual Does patient have children?: Yes How many children?: 2 How is patient's relationship with their children?: 2 sons; great relationship with two sons 65(24 yr old son is married and 46 yr old son)  Childhood History:  Childhood History By whom was/is the patient raised?: Both parents Additional childhood history information: [place]  Only child.  Mom only had one due to having BHR Negative (She  would bleed easily).  Mom had pt when she was age 46.  "Good childhood."  Denies any abuse or trauma.  Reports being bullied in school d/t the clothes she wore.  "There was a time I wasn't wearing the name brand clothes and I would get teased real bad."  Pt states at age 46 is when the eating d/o started.                     Description of patient's relationship with caregiver when they were a child: great relationship with parents Patient's description of current relationship with people who raised him/her: Still remains very close to father,  since mother died. Does patient have siblings?: No Did patient suffer any verbal/emotional/physical/sexual abuse as a child?: No Did patient suffer from severe childhood neglect?: No Has patient ever been sexually abused/assaulted/raped as an adolescent or adult?: No Was the patient ever a victim of a crime or a disaster?: No Witnessed domestic violence?: No Has patient been effected by domestic violence as an adult?: No  CCA Part Two B  Employment/Work Situation: Employment / Work Psychologist, occupational Employment situation: Biomedical scientist job has been impacted by current illness: No What is the longest time patient has a held a job?: 15 years 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  Has patient ever been in the [employer]?: No Has patient ever served in combat?: No Did You Receive Any Psychiatric Treatment/Services While in Equities trader?: No Are There Guns or Other Weapons in Your Home?: No Are These Comptroller?:  (n/a)  Education: Education Did Garment/textile technologist From [employer]?: Yes Did Theme park manager?:  ([employer]) Did Designer, television/film set?: No Did You Have An Individualized Education Program (IIEP): No Did You Have Any Difficulty At [employer]?:  (Pt c/o being bullied because she wasn't wearing name brand clothes)  Religion: Religion/Spirituality Are You A Religious Person?: Yes What is Your Religious  Affiliation?: Pentecostal  Leisure/Recreation: Leisure / Recreation Leisure and Hobbies: spending time with sons; color, reading, making jewelry  Exercise/Diet: Exercise/Diet Do You Exercise?: No Have You Gained or Lost A Significant Amount of Weight in the Past Six Months?: Yes-Gained Number of Pounds Gained: 33 Do You Follow a Special Diet?: No Do You Have Any Trouble Sleeping?: Yes Explanation of Sleeping Difficulties: Difficulty staying asleep  CCA Part Two C  Alcohol/Drug Use: Alcohol / Drug Use Pain Medications: Denies Prescriptions: Denies Over the Counter: Denies History of alcohol / drug use?: Yes (hx of self-medicating with ETOH.  Last drink was in November 2017) Longest period of sobriety (when/how long): Denies                      CCA Part Three  ASAM's:  Six Dimensions of Multidimensional Assessment  Dimension 1:  Acute Intoxication and/or Withdrawal Potential:     Dimension 2:  Biomedical Conditions and Complications:     Dimension 3:  Emotional, Behavioral, or Cognitive Conditions and Complications:     Dimension 4:  Readiness to Change:     Dimension 5:  Relapse, Continued use, or Continued Problem Potential:     Dimension 6:  Recovery/Living Environment:      Substance use Disorder (SUD)    Social Function:  Social Functioning Social Maturity: Isolates Social Judgement: Normal  Stress:  Stress Stressors: Veterinary surgeon, Money Coping Ability: Overwhelmed Patient Takes Medications The Way The Doctor Instructed?: Yes (hx of non-compliancy) Priority Risk: Moderate Risk  Risk Assessment- Self-Harm Potential: Risk Assessment For Self-Harm Potential Thoughts of Self-Harm: No current thoughts Method: No plan Availability of Means: No access/NA Additional Information for Self-Harm Potential: Previous Attempts Additional Comments for Self-Harm Potential: three prior attempts (two overdoses and one cutting wrists)  Risk Assessment -Dangerous to  Others Potential: Risk Assessment For Dangerous to Others Potential Method: No Plan Availability of Means: No access or NA Intent: Vague intent or NA Notification Required: No need or identified person  DSM5 Diagnoses: Patient Active Problem List   Diagnosis Date Noted  . Abnormal cardiovascular stress test   . Family history of coronary artery disease in father 05/23/2016  . Precordial chest  pain 05/22/2016  . Normocytic anemia 05/22/2016  . Severe episode of recurrent major depressive disorder, without psychotic features (HCC)   . MDD (major depressive disorder), recurrent severe, without psychosis (HCC) 05/19/2016    Patient Centered Plan: Patient is on the following Treatment Plan(s):  Anxiety and Depression  Recommendations for Services/Supports/Treatments: Recommendations for Services/Supports/Treatments Recommendations For Services/Supports/Treatments: IOP (Intensive Outpatient Program)  Treatment Plan Summary:  Oriented pt to MH-IOP.  Provided pt with a folder.  Pt will attend group therapy and psycho-educational groups on a daily basis.  Encouraged support groups.  Will refer pt to a therapist and psychiatrist in [facility].    Referrals to Alternative Service(s): Referred to Alternative Service(s):   Place:   Date:   Time:    Referred to Alternative Service(s):   Place:   Date:   Time:    Referred to Alternative Service(s):   Place:   Date:   Time:    Referred to Alternative Service(s):   Place:   Date:   Time:     Adalyn Pennock, RITA, M.Ed, CNA

## 2016-06-08 ENCOUNTER — Other Ambulatory Visit (HOSPITAL_COMMUNITY): Payer: BLUE CROSS/BLUE SHIELD | Admitting: Psychiatry

## 2016-06-08 DIAGNOSIS — F332 Major depressive disorder, recurrent severe without psychotic features: Secondary | ICD-10-CM

## 2016-06-08 MED ORDER — CITALOPRAM HYDROBROMIDE 20 MG PO TABS: 20.0000 mg | ORAL_TABLET | Freq: Every day | ORAL | 2 refills | Status: AC

## 2016-06-08 MED ORDER — TRAZODONE HCL 50 MG PO TABS: 100.0000 mg | ORAL_TABLET | Freq: Every day | ORAL | 0 refills | Status: DC

## 2016-06-08 NOTE — Progress Notes (Signed)
    Daily Group Progress Note  Program: IOP  Group Time: 9:00-12:00  Participation Level: Active  Behavioral Response: Appropriate  Type of Therapy:  Group Therapy  Summary of Progress: Pt. Met with case manager and psychiatrist. Pt. Was alert, talkative and appropriately tearful. Pt. Discussed history of being taken for granted in relationship and learning to assert her needs in relationships.       Nancie Neas, LPC

## 2016-06-08 NOTE — Progress Notes (Signed)
Psychiatric Initial Adult Assessment   Patient Identification: Betty PresserDianne R Osborne MRN:  [id number] Date of Evaluation:  06/08/2016 Ref[facility] Chief Complaint:depression persists after inpatient and medication changes   Visit Diagnosis: No diagnosis found.  History of Present Illness:  Ms Betty Osborne has been depressed for years.  Menopause started after her second son was born and both the anxiety and depression seemed to date from then.  She has made 3 suicidal attempts over the years with 2 in 2017 the last being in Nov.  Medication has been changed and the new antidepressants were started on 20 Dec so they have not had a chance to work yet.  She has regular anxiety attacks as well.  She is stressed out which seems to be the main problem made worse by hormonal changes.  Stressors are the death of her mother in 2016.  She is an only child and was very close to her mother who lived next door.  Her father has a heart transplant 20 years ago and was very dependent on her mother as well.  He needs back surgery probably for crushed vertebra but is is risky because of the heart issues.  Finances are an issue because she does not work and has large hospital bills.  She has IBS, arthritis and fibromyalgia and most of the hospitalizations were for IBS.  She is bothered that she has gained 33 lbs and cannot seem to get it off.  She remains depressed, anxious and stressed out.  Associated Signs/Symptoms: Depression Symptoms:  depressed mood, anhedonia, insomnia, hypersomnia, fatigue, feelings of worthlessness/guilt, difficulty concentrating, hopelessness, impaired memory, anxiety, loss of energy/fatigue, (Hypo) Manic Symptoms:  Irritable Mood, Anxiety Symptoms:  Excessive Worry, Panic Symptoms, Psychotic Symptoms:  none PTSD Symptoms: Negative  Past Psychiatric History: 2 inpatient stays with outpatient med management and therapy  Previous Psychotropic Medications: Yes   Substance  Abuse History in the last 12 months:  No.  Consequences of Substance Abuse: Negative  Past Medical History:  Past Medical History:  Diagnosis Date  . Anxiety   . Anxiety and depression   . Depression   . Fibromyalgia   . IBS (irritable bowel syndrome)   . Overdose   . Tachycardia     Past Surgical History:  Procedure Laterality Date  . APPENDECTOMY    . CARDIAC CATHETERIZATION N/A 05/24/2016   Procedure: Left Heart Cath and Coronary Angiography;  Surgeon: Kathleene Hazelhristopher D McAlhany, MD;  [facility];  Service: Cardiovascular;  Laterality: N/A;  . CHOLECYSTECTOMY      Family Psychiatric History: none  Family History:  Family History  Problem Relation Age of Onset  . Heart disease Father     had a heart transplant in 2008  . Anxiety disorder Paternal Aunt   . Depression Paternal Aunt   . Alcohol abuse Paternal Uncle   . Anxiety disorder Maternal Grandfather   . Depression Maternal Grandfather     Social History:   Social History   Social History  . Marital status: Married    Spouse name: N/A  . Number of children: N/A  . Years of education: N/A   Social History Main Topics  . Smoking status: Never Smoker  . Smokeless tobacco: Never Used  . Alcohol use No     Comment: admits to hx of self medicating with ETOH   . Drug use: No  . Sexual activity: Yes   Other Topics Concern  . Not on file   Social History  Narrative  . No narrative on file    Additional Social History: none  Allergies:   Allergies  Allergen Reactions  . Dilaudid [Hydromorphone Hcl]     Metabolic Disorder Labs: No results found for: HGBA1C, MPG No results found for: PROLACTIN Lab Results  Component Value Date   CHOL 137 05/23/2016   TRIG 65 05/23/2016   HDL 51 05/23/2016   CHOLHDL 2.7 05/23/2016   VLDL 13 05/23/2016   LDLCALC 73 05/23/2016     Current Medications: Current Outpatient Prescriptions  Medication Sig Dispense Refill  . benzonatate (TESSALON) 100  MG capsule Take 100 mg by mouth 3 (three) times daily as needed for cough.    Marland Kitchen buPROPion (WELLBUTRIN XL) 150 MG 24 hr tablet Take 1 tablet (150 mg total) by mouth daily. 30 tablet 0  . citalopram (CELEXA) 10 MG tablet Take 1 tablet (10 mg total) by mouth daily. 30 tablet 0  . fluticasone (FLONASE) 50 MCG/ACT nasal spray Place 2 sprays into both nostrils daily.    Marland Kitchen gabapentin (NEURONTIN) 400 MG capsule Take 2 capsules (800 mg total) by mouth 3 (three) times daily. 180 capsule 0  . LORazepam (ATIVAN) 0.5 MG tablet Take 1 tablet (0.5 mg total) by mouth 2 (two) times daily as needed for anxiety. 30 tablet 0  . metFORMIN (GLUCOPHAGE) 1000 MG tablet Take 1 tablet (1,000 mg total) by mouth 2 (two) times daily with a meal. 60 tablet 0  . polycarbophil (FIBERCON) 625 MG tablet Take 625 mg by mouth 2 (two) times daily.    . traZODone (DESYREL) 50 MG tablet Take 1 tablet (50 mg total) by mouth at bedtime. 30 tablet 0   No current facility-administered medications for this visit.     Neurologic: Headache: Negative Seizure: Negative Paresthesias:Negative  Musculoskeletal: Strength & Muscle Tone: within normal limits Gait & Station: normal Patient leans: N/A  Psychiatric Specialty Exam: ROS  There were no vitals taken for this visit.There is no height or weight on file to calculate BMI.  General Appearance: Well Groomed  Eye Contact:  Good  Speech:  Clear and Coherent  Volume:  Normal  Mood:  Anxious and Depressed  Affect:  Congruent  Thought Process:  Coherent and Goal Directed  Orientation:  Full (Time, Place, and Person)  Thought Content:  Logical  Suicidal Thoughts:  Yes.  without intent/plan  Homicidal Thoughts:  No  Memory:  Immediate;   Good Recent;   Good Remote;   Good  Judgement:  Intact  Insight:  Fair  Psychomotor Activity:  Normal  Concentration:  Concentration: Good and Attention Span: Good  Recall:  Good  Fund of Knowledge:Good  Language: Good  Akathisia:  Negative   Handed:  Right  AIMS (if indicated):  0  Assets:  Communication Skills Desire for Improvement Financial Resources/Insurance Housing Intimacy Leisure Time Social Support Talents/Skills Transportation Vocational/Educational  ADL's:  Intact  Cognition: WNL  Sleep:  poor    Treatment Plan Summary: Admit to IOP.  Increase trazodone to 100 mg from 50 mg, increase Celexa to 20 mg from 10 mg.   Carolanne Grumbling, MD 1/3/201812:45 PM

## 2016-06-09 ENCOUNTER — Other Ambulatory Visit (HOSPITAL_COMMUNITY): Payer: BLUE CROSS/BLUE SHIELD | Admitting: Psychiatry

## 2016-06-09 DIAGNOSIS — F332 Major depressive disorder, recurrent severe without psychotic features: Secondary | ICD-10-CM | POA: Diagnosis not present

## 2016-06-09 NOTE — Progress Notes (Signed)
    Daily Group Progress Note  Program: IOP  Group Time:  9:00-12:00  Participation Level: Active  Behavioral Response: Appropriate  Type of Therapy:  Group Therapy  Summary of Progress: Pt. Presented as talkative, engaged in the group process. Pt. Discussed her history of letting go of relationships that took her for granted, ongoing process of grieving after death of her mother, recovering from recent suicide attempt, reconnection with her relationship with God, and rediscovery of her personal identity at this place in her life. Pt. Participated in intervention to identify a word that will help her focus her efforts in the next year. Pt. Identified the word "Rebirthing" because she feels that she nearly died and is relearning how to live and rediscovering her relationship with herself and God.     Shaune PollackBrown, Jennifer B, LPC

## 2016-06-10 ENCOUNTER — Other Ambulatory Visit (HOSPITAL_COMMUNITY): Payer: BLUE CROSS/BLUE SHIELD

## 2016-06-13 ENCOUNTER — Other Ambulatory Visit (HOSPITAL_COMMUNITY): Payer: BLUE CROSS/BLUE SHIELD

## 2016-06-14 ENCOUNTER — Other Ambulatory Visit (HOSPITAL_COMMUNITY): Payer: BLUE CROSS/BLUE SHIELD

## 2016-06-15 ENCOUNTER — Other Ambulatory Visit (HOSPITAL_COMMUNITY): Payer: BLUE CROSS/BLUE SHIELD

## 2016-06-16 ENCOUNTER — Other Ambulatory Visit (HOSPITAL_COMMUNITY): Payer: BLUE CROSS/BLUE SHIELD

## 2016-06-17 ENCOUNTER — Other Ambulatory Visit (HOSPITAL_COMMUNITY): Payer: BLUE CROSS/BLUE SHIELD

## 2016-06-20 ENCOUNTER — Other Ambulatory Visit (HOSPITAL_COMMUNITY): Payer: BLUE CROSS/BLUE SHIELD

## 2016-06-21 ENCOUNTER — Other Ambulatory Visit (HOSPITAL_COMMUNITY): Payer: BLUE CROSS/BLUE SHIELD

## 2016-06-22 ENCOUNTER — Other Ambulatory Visit (HOSPITAL_COMMUNITY): Payer: BLUE CROSS/BLUE SHIELD

## 2016-06-23 ENCOUNTER — Other Ambulatory Visit (HOSPITAL_COMMUNITY): Payer: BLUE CROSS/BLUE SHIELD

## 2016-06-23 NOTE — Discharge Summary (Signed)
Betty PresserDianne R Callejo was admitted for major depression, anxiety  and crisis management.  Medical problems were identified and treated as needed.  Home medications were restarted as appropriate.  Betty PresserDianne R Maddux was evaluated by the treatment team for stability and plans for continued recovery upon discharge. Patient was evaluated and transferred to ED for complaints of chest pain on 05/22/2016

## 2016-06-24 ENCOUNTER — Other Ambulatory Visit (HOSPITAL_COMMUNITY): Payer: BLUE CROSS/BLUE SHIELD

## 2016-06-27 ENCOUNTER — Other Ambulatory Visit (HOSPITAL_COMMUNITY): Payer: BLUE CROSS/BLUE SHIELD | Admitting: Psychiatry

## 2016-06-27 DIAGNOSIS — F332 Major depressive disorder, recurrent severe without psychotic features: Secondary | ICD-10-CM | POA: Diagnosis not present

## 2016-06-28 ENCOUNTER — Other Ambulatory Visit (HOSPITAL_COMMUNITY): Payer: BLUE CROSS/BLUE SHIELD

## 2016-06-29 ENCOUNTER — Other Ambulatory Visit (HOSPITAL_COMMUNITY): Payer: BLUE CROSS/BLUE SHIELD

## 2016-06-29 MED ORDER — BUPROPION HCL ER (XL) 300 MG PO TB24: 300.0000 mg | ORAL_TABLET | ORAL | 2 refills | Status: AC

## 2016-06-29 MED ORDER — TRAZODONE HCL 100 MG PO TABS: 100.0000 mg | ORAL_TABLET | Freq: Every day | ORAL | 2 refills | Status: AC

## 2016-06-29 NOTE — Progress Notes (Signed)
Increased Wellbutrin XL to 300 mg as she is still depressed.Patient ID: Volney PresserDianne R Mcneill, female   DOB: 1971-02-08, 46 y.o.   MRN: [id number]

## 2016-06-29 NOTE — Progress Notes (Signed)
    Daily Group Progress Note  Program: IOP  Group Time: 9:00-12:00   Participation Level: minimal   Behavioral Response:  flat   Type of Therapy:  group therapy   Summary of Progress:  This was pt's first day back in group after her illness with the flu. She reported having a great night on Sunday. She had a family dinner with her sons, husband, and father. They laughed and reminisced about her mother, who passed away last year, and overall had a good time. Pt slept for a full 6 hours that night, and felt more rested than she has in a long time. She reflected in group on how when her mother passed away she became angry with God for a time, and even questioned her faith. Around the time when her mother had recently passed, pt also shared that she was struggling with anger problems, however medication has assisted greatly with those.          Shaune PollackBrown, Eren Puebla B, LPC

## 2016-06-29 NOTE — Addendum Note (Signed)
Addended by: Carolanne GrumblingAYLOR, Tyshika Baldridge D on: 06/29/2016 12:16 PM   Modules accepted: Orders

## 2016-06-30 ENCOUNTER — Other Ambulatory Visit (HOSPITAL_COMMUNITY): Payer: BLUE CROSS/BLUE SHIELD | Admitting: Psychiatry

## 2016-06-30 DIAGNOSIS — F332 Major depressive disorder, recurrent severe without psychotic features: Secondary | ICD-10-CM

## 2016-07-01 ENCOUNTER — Ambulatory Visit (HOSPITAL_COMMUNITY): Payer: Self-pay

## 2016-07-04 ENCOUNTER — Ambulatory Visit (HOSPITAL_COMMUNITY): Payer: Self-pay

## 2016-07-05 ENCOUNTER — Ambulatory Visit (HOSPITAL_COMMUNITY): Payer: Self-pay

## 2016-07-05 NOTE — Progress Notes (Signed)
    Daily Group Progress Note  Program: IOP  Group Time: 9:00-12:00   Participation Level: active    Behavioral Response:engaged    Type of Therapy:  group therapy  Summary of Progress: Pt reports having a good day yesterday. She slept well and felt like her mood was okay. She shared a lot about her experience of grieving for her mother. She relayed that when she felt through the pain and sadness she also found she was able to connect with her positive memories of her mother. Mental Health association visited and gave a presentation on their programs.  Shaune PollackBrown, Jennifer B, LPC

## 2016-07-06 ENCOUNTER — Other Ambulatory Visit (HOSPITAL_COMMUNITY): Payer: BLUE CROSS/BLUE SHIELD | Admitting: Psychiatry

## 2016-07-06 DIAGNOSIS — F332 Major depressive disorder, recurrent severe without psychotic features: Secondary | ICD-10-CM

## 2016-07-06 NOTE — Progress Notes (Signed)
    Daily Group Progress Note  Program: IOP  Group Time: 9:00-12:00  Participation Level: Active  Behavioral Response: Appropriate  Type of Therapy:  Group Therapy  Summary of Progress: Pt. Presented with brightened affect, engaged in the group process. Pt. Discussed some anxiety related to discharge from the group next week and need for referrals to support group to assist with transition from group therapy. Pt. Participated in discussion about discussion of patterns of emotional eating, body dysmorphia and depression. Pt. Discussed recently making a commitment to losing weight and need to monitor her efforts in light of her history of anorexia. Pt. Discussed that she feels safe because she has an accountability partner in her best friend who will help her if her eating behavior becomes unhealthy again.       Shaune PollackBrown, Jennifer B, LPC

## 2016-07-07 ENCOUNTER — Ambulatory Visit (HOSPITAL_COMMUNITY): Payer: Self-pay

## 2016-07-08 ENCOUNTER — Ambulatory Visit (HOSPITAL_COMMUNITY): Payer: Self-pay

## 2016-07-14 ENCOUNTER — Other Ambulatory Visit (HOSPITAL_COMMUNITY): Payer: Self-pay | Admitting: Psychiatry

## 2016-07-14 MED ORDER — GABAPENTIN 400 MG PO CAPS: 800.0000 mg | ORAL_CAPSULE | Freq: Three times a day (TID) | ORAL | 1 refills | Status: AC

## 2016-07-14 NOTE — Progress Notes (Signed)
Called in refill for gabapentin 400 mg caps:  Take 2 tid

## 2016-07-15 ENCOUNTER — Ambulatory Visit (HOSPITAL_COMMUNITY): Payer: Self-pay | Admitting: Psychiatry

## 2016-07-19 NOTE — Patient Instructions (Signed)
Discharge today due to non-compliancy with attendance.  F/U with Dr. Lolly MustacheArfeen on 08-19-16 @ 9 am and Boneta LucksJennifer Brown, PhD on 08-29-16 @ 3pm.  Encouraged support groups.

## 2016-07-19 NOTE — Progress Notes (Addendum)
Betty Osborne is a 46 y.o. , married, unemployed, Caucasian female who was transitioned from the inpatient psychiatric unit; treatment for worsening depressive and anxiety symptoms.  According to pt, [facility] before [facility] with c/o chest pain.  [facility]  Pt reports a long hx of struggling with depression and anxiety.  According to pt, she has been doing ok until she stopped taking all her medications.  It is unclear as to why pt chose to stop, but she did mention that her family was commenting how strong she was and didn't need it.  Pt also added that her PCP stopped and changed some of her medications.  "I haven't been feeling right ever since the change."  Stressors/Triggers:  1)  Unresolved grief/loss issues:  July 2016 mother died.  "She was my best friend."  Father resides next door to pt.  Eight yrs ago (Jan. 17th) he has a heart transplant and is in need of back surgery.  Pt is very worried about him.  2)  Strained finances d/t excessive hospital bills.  Pt states she has been in and out of the hospital due to IBS issues.  3)  Health Issues:  IBS, Arthritis in knees and hips, Fibromyalgia.  Pt has a hx of struggling with an eating d/o.  Pt states she's been stress eating and has gained 33 lbs within six months.  "So my wt issues are affecting my self esteem a lot.  I feel fat."  The stress is feeding into her anxiety thus pt is very anxious about her health.  She has had a stress test, etc.  "The doctor said my heart is fine."  Family Hx:  Maternal GF and Paternal Aunt:  Depression and Anxiety; Paternal Uncle:  ETOH. Beginning 07-07-16, pt began missing a lot of days due to illness, inclement weather and family illness.  Pt had stated she would return last week but wasn't able to due to Shingles.  Since pt didn't return this week; Clinical research associatewriter spoke with her about discharging due to absences.  Encouraged pt to call if she would like to return once  she recovers.  Pt denied SI/HI or A/V hallucinations.  A:  D/C today.  F/U with Dr. Lolly MustacheArfeen on 08-19-16 @ 9am and Boneta LucksJennifer Brown, PhD on 08-29-16 @ 3pm.  Encouraged support groups.  R:  Pt receptive.                                                                                                                             Chestine SporeLARK, RITA, M.Ed, CNA

## 2016-07-19 NOTE — Progress Notes (Signed)
[facility] IOP DISCHARGE NOTE  Patient:  Volney PresserDianne R Mangen DOB:  [birth year]  Date of Admission: 06/08/2016  Date of Discharge:07/19/2016   Reason for Admission:severe depression  IOP Course:Ms Renae GlossShelton seemed to benefit from the group therapy but she had a series of health issues that prevented her attending.  Time was extended hoping she would get better and because she did get help from attending, but eventually no further extension seemed reasonable.  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   MRN: [id number]

## 2016-07-21 ENCOUNTER — Ambulatory Visit (HOSPITAL_COMMUNITY): Payer: Self-pay | Admitting: Psychiatry

## 2016-08-19 ENCOUNTER — Ambulatory Visit (HOSPITAL_COMMUNITY): Payer: Self-pay | Admitting: Psychiatry

## 2016-08-29 ENCOUNTER — Ambulatory Visit (HOSPITAL_COMMUNITY): Payer: Self-pay | Admitting: Psychiatry

## 2016-10-18 ENCOUNTER — Ambulatory Visit (HOSPITAL_COMMUNITY): Payer: Self-pay | Admitting: Psychiatry

## 2016-10-19 ENCOUNTER — Ambulatory Visit (HOSPITAL_COMMUNITY): Payer: Self-pay | Admitting: Psychiatry

## 2017-06-05 IMAGING — CR DG CHEST 1V PORT
1 series · 1 of 1 positions shown · non-contrast
Comparison: April 24, 2016

CLINICAL DATA: Chest pain

EXAM:
PORTABLE CHEST 1 VIEW

[AP]
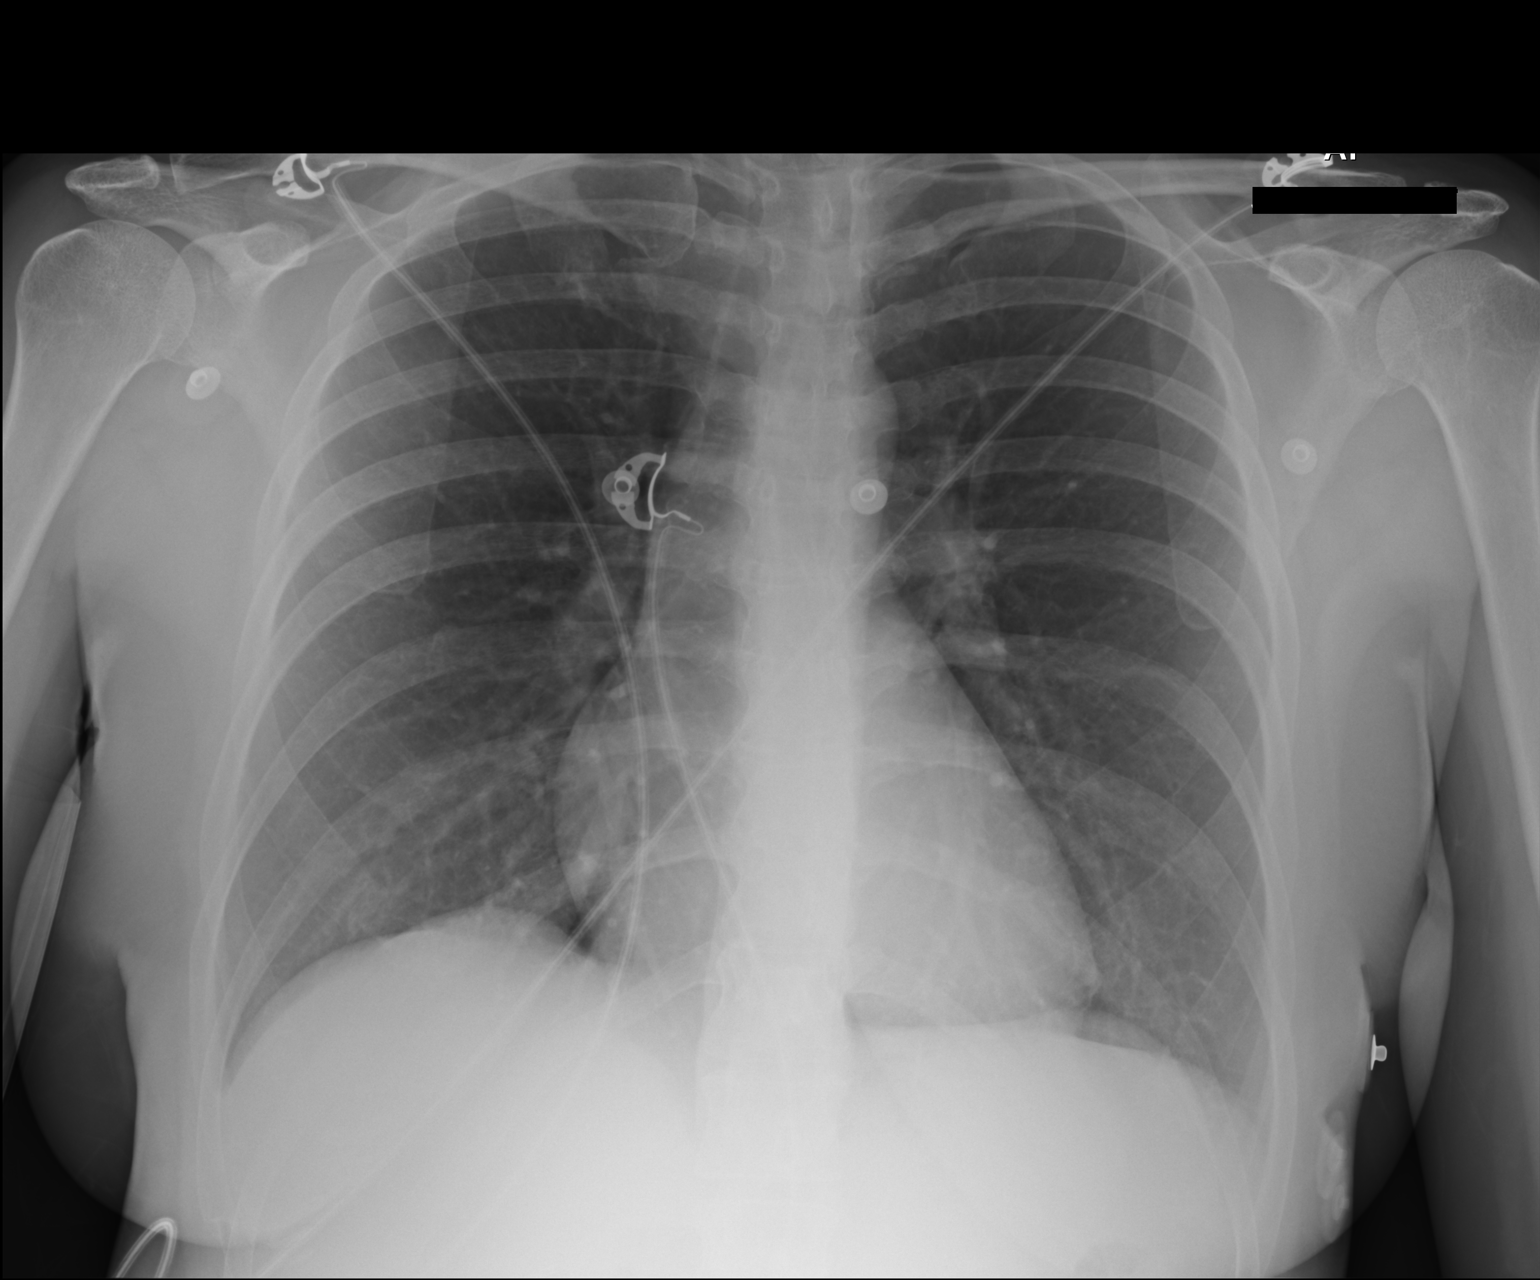

[1 of 1 positions shown; findings below may reference images not displayed]

FINDINGS: The heart size and mediastinal contours are within normal limits.
Both lungs are clear. The visualized skeletal structures are
unremarkable.
IMPRESSION: No active disease.
